# Patient Record
Sex: Female | Born: 1990 | Race: Asian | Hispanic: No | Marital: Single
Health system: Midwestern US, Community
[De-identification: ages and names within clinical notes are randomized; demographics above are authoritative.]

## PROBLEM LIST (undated history)

## (undated) DIAGNOSIS — J45909 Unspecified asthma, uncomplicated: Secondary | ICD-10-CM

## (undated) DIAGNOSIS — J4520 Mild intermittent asthma, uncomplicated: Secondary | ICD-10-CM

## (undated) HISTORY — DX: Unspecified asthma, uncomplicated: J45.909

## (undated) HISTORY — PX: NO PAST SURGERIES: SHX2092

---

## 2013-07-10 ENCOUNTER — Emergency Department (INDEPENDENT_AMBULATORY_CARE_PROVIDER_SITE_OTHER)
Admission: EM | Admit: 2013-07-10 | Discharge: 2013-07-10 | Disposition: A | Discharge status: Home / Self Care | Payer: Medicaid Other | Source: Home / Self Care | Attending: Emergency Medicine | Admitting: Emergency Medicine

## 2013-07-10 ENCOUNTER — Emergency Department (INDEPENDENT_AMBULATORY_CARE_PROVIDER_SITE_OTHER): Payer: Medicaid Other

## 2013-07-10 ENCOUNTER — Encounter (HOSPITAL_COMMUNITY): Payer: Self-pay | Admitting: Emergency Medicine

## 2013-07-10 DIAGNOSIS — J45909 Unspecified asthma, uncomplicated: Secondary | ICD-10-CM

## 2013-07-10 DIAGNOSIS — J020 Streptococcal pharyngitis: Secondary | ICD-10-CM

## 2013-07-10 LAB — POCT RAPID STREP A: Streptococcus, Group A Screen (Direct): POSITIVE — AB

## 2013-07-10 MED ORDER — IPRATROPIUM BROMIDE 0.02 % IN SOLN
0.5000 mg | Freq: Once | RESPIRATORY_TRACT | Status: AC
  Administered 2013-07-10: 0.5 mg via RESPIRATORY_TRACT

## 2013-07-10 MED ORDER — AMOXICILLIN 500 MG PO CAPS: 500.0000 mg | ORAL_CAPSULE | Freq: Three times a day (TID) | ORAL | Status: DC

## 2013-07-10 MED ORDER — ALBUTEROL SULFATE (5 MG/ML) 0.5% IN NEBU
5.0000 mg | INHALATION_SOLUTION | Freq: Once | RESPIRATORY_TRACT | Status: AC
  Administered 2013-07-10: 5 mg via RESPIRATORY_TRACT

## 2013-07-10 MED ORDER — GUAIFENESIN-CODEINE 100-10 MG/5ML PO SYRP: 10.0000 mL | ORAL_SOLUTION | Freq: Four times a day (QID) | ORAL | Status: AC | PRN

## 2013-07-10 MED ORDER — ALBUTEROL SULFATE HFA 108 (90 BASE) MCG/ACT IN AERS: 2.0000 | INHALATION_SPRAY | RESPIRATORY_TRACT | Status: AC | PRN

## 2013-07-10 MED ORDER — ALBUTEROL SULFATE (5 MG/ML) 0.5% IN NEBU
INHALATION_SOLUTION | RESPIRATORY_TRACT | Status: AC
  Filled 2013-07-10: qty 1

## 2013-07-10 MED ORDER — METHYLPREDNISOLONE SODIUM SUCC 125 MG IJ SOLR
125.0000 mg | Freq: Once | INTRAMUSCULAR | Status: AC
  Administered 2013-07-10: 125 mg via INTRAMUSCULAR

## 2013-07-10 MED ORDER — ALBUTEROL SULFATE (5 MG/ML) 0.5% IN NEBU
2.5000 mg | INHALATION_SOLUTION | Freq: Once | RESPIRATORY_TRACT | Status: AC
  Administered 2013-07-10: 2.5 mg via RESPIRATORY_TRACT

## 2013-07-10 MED ORDER — PREDNISONE 20 MG PO TABS: ORAL_TABLET | ORAL | Status: DC

## 2013-07-10 MED ORDER — METHYLPREDNISOLONE SODIUM SUCC 125 MG IJ SOLR
INTRAMUSCULAR | Status: AC
  Filled 2013-07-10: qty 2

## 2013-07-10 MED ORDER — BECLOMETHASONE DIPROPIONATE 80 MCG/ACT IN AERS: 3.0000 | INHALATION_SPRAY | Freq: Two times a day (BID) | RESPIRATORY_TRACT | Status: DC

## 2013-07-10 NOTE — ED Notes (Signed)
Not triaged by this nurse-student ma Vanessa Kick) triaged

## 2013-07-10 NOTE — ED Notes (Signed)
Went to get patient for CXR, patient was getting breathing treatment

## 2013-07-10 NOTE — ED Notes (Signed)
Pt c/o dry cough, sob, sore throat, and aching on left side of chest. Pt states she has a history of asthma and she needs asthma medication. Pt also stated she has only been in this country 2 days (from [place]). No meds taken for sxs. Jan Ranson, SMA

## 2013-07-10 NOTE — ED Provider Notes (Signed)
Chief Complaint:   Chief Complaint  Patient presents with  . Cough    History of Present Illness:   Erin Evans is a 22 year old Nepali female who has just arrived in the country 2 days ago. She has a lifelong history of asthma and has been hospitalized many times in [place] because of asthma but has never been on a ventilator. She's on several Nepali medications including 2 inhalers and a pill. One of the inhalers is beclomethasone, the other one is salbutamol and she has a salbutamol pill that she's taking. Despite these her asthma has not been well controlled. She continues to have coughing, wheezing, and sore throat. She denies any fever or chills. No headache, nasal congestion, chest pain, or GI symptoms.  Review of Systems:  Other than noted above, the patient denies any of the following symptoms. Systemic:  No fever, chills, sweats, fatigue, myalgias, headache, weight loss or anorexia. ENT:  No earache, ear congestion, nasal congestion, sneezing, rhinorrhea, sinus pressure, sinus pain, post nasal drip, or sore throat. Lungs:  No cough, sputum production, or shortness of breath. No chest pain. Skin:  No rash or itching.  PMFSH:  Past medical history, family history, social history, meds, and allergies were reviewed.  No history of allergic rhinitis.  No use of tobacco.   Physical Exam:   Vital signs:  BP 102/70  Pulse 76  Temp(Src) 98.1 F (36.7 C) (Oral)  Resp 15  SpO2 98%  LMP 06/25/2013 General:  Alert, in no distress. No respiratory distress, but she's coughing frequently. Eye:  No conjunctival injection or drainage. Lids were normal. ENT:  TMs and canals were normal, without erythema or inflammation.  Nasal mucosa was clear and uncongested, without drainage.  Mucous membranes were moist.  Pharynx was clear, without exudate or drainage.  There were no oral ulcerations or lesions. Neck:  Supple, no adenopathy, tenderness or mass. Lungs:  No retractions or use of accessory muscles.   No respiratory distress.  She has bilateral expiratory wheezes, no rales or rhonchi. Heart:  Regular rhythm, without gallops, murmers or rubs. Skin:  Clear, warm, and dry, without rash or lesions.  Results for orders placed during the hospital encounter of 07/10/13  POCT RAPID STREP A ([facility] CARE ONLY)      Result Value Range   Streptococcus, Group A Screen (Direct) POSITIVE (*) NEGATIVE   Radiology:  Dg Chest 2 View  07/10/2013   CLINICAL DATA:  22 year old female with cough. Recent international travel.  EXAM: CHEST  2 VIEW  COMPARISON:  None.  FINDINGS: Somewhat large lung volumes. Normal cardiac size and mediastinal contours. Visualized tracheal air column is within normal limits. No pneumothorax, pulmonary edema, pleural effusion or confluent pulmonary opacity. Mild scoliosis. No acute osseous abnormality identified.  IMPRESSION: No focal pneumonia or acute cardiopulmonary abnormality identified.   Electronically Signed   By: Augusto Gamble M.D.   On: 07/10/2013 19:12    Course in Urgent Care Center:   Given Solu-Medrol 125 mg IM and a DuoNeb breathing treatment. Thereafter her lungs sounded better but she was still coughing and was given a second DuoNeb breathing treatment. After that her lungs were clear and she had no coughing and states she feels better.  Assessment:  The primary encounter diagnosis was Asthma. A diagnosis of Strep throat was also pertinent to this visit.  She'll need followup with her primary care physician and was given the name of the [facility]  Plan:  1.  Meds:  The following meds were prescribed:   Discharge Medication List as of 07/10/2013  7:37 PM    START taking these medications   Details  albuterol (PROVENTIL HFA;VENTOLIN HFA) 108 (90 BASE) MCG/ACT inhaler Inhale 2 puffs into the lungs every 4 (four) hours as needed for wheezing., Starting 07/10/2013, Until Discontinued, Normal    amoxicillin (AMOXIL) 500 MG capsule Take 1 capsule  (500 mg total) by mouth 3 (three) times daily., Starting 07/10/2013, Until Discontinued, Normal    beclomethasone (QVAR) 80 MCG/ACT inhaler Inhale 3 puffs into the lungs 2 (two) times daily., Starting 07/10/2013, Until Discontinued, Normal    guaiFENesin-codeine (GUIATUSS AC) 100-10 MG/5ML syrup Take 10 mLs by mouth 4 (four) times daily as needed for cough., Starting 07/10/2013, Until Discontinued, Print    predniSONE (DELTASONE) 20 MG tablet 3 daily for 7 days, 2 daily for 7 days, 1 daily for 7 days., Normal        2.  Patient Education/Counseling:  The patient was given appropriate handouts, self care instructions, and instructed in symptomatic relief.  Suggested she avoid dust, smoke, perfumes, and any allergens.  3.  Follow up:  The patient was told to follow up if no better in 3 to 4 days, if becoming worse in any way, and given some red flag symptoms such as increase in difficulty breathing which would prompt immediate return.  Follow up with [facility] and [facility].       Reuben Likes, MD 07/10/13 2111

## 2013-07-10 NOTE — ED Notes (Signed)
Here in [place] x 2 days; friend translating, had asthma as child

## 2013-09-18 ENCOUNTER — Encounter: Payer: Self-pay | Admitting: Internal Medicine

## 2013-09-18 ENCOUNTER — Ambulatory Visit: Payer: Medicaid Other | Attending: Internal Medicine | Admitting: Internal Medicine

## 2013-09-18 VITALS — BP 98/51 | HR 89 | Temp 98.5°F | Resp 14 | Ht 59.0 in | Wt 104.0 lb

## 2013-09-18 DIAGNOSIS — J45909 Unspecified asthma, uncomplicated: Secondary | ICD-10-CM | POA: Insufficient documentation

## 2013-09-18 DIAGNOSIS — Z23 Encounter for immunization: Secondary | ICD-10-CM

## 2013-09-18 DIAGNOSIS — J449 Chronic obstructive pulmonary disease, unspecified: Secondary | ICD-10-CM

## 2013-09-18 MED ORDER — ALBUTEROL SULFATE HFA 108 (90 BASE) MCG/ACT IN AERS: 2.0000 | INHALATION_SPRAY | Freq: Four times a day (QID) | RESPIRATORY_TRACT | Status: DC | PRN

## 2013-09-18 MED ORDER — BECLOMETHASONE DIPROPIONATE 40 MCG/ACT IN AERS: 1.0000 | INHALATION_SPRAY | Freq: Two times a day (BID) | RESPIRATORY_TRACT | Status: AC

## 2013-09-18 NOTE — Progress Notes (Signed)
Pt here to establish care for hx Asthma [place] interpretor present

## 2013-09-18 NOTE — Progress Notes (Signed)
Patient ID: Erin Evans, female   DOB: [birth year], 22 y.o.   MRN: [id number]  CC: New patient  HPI: 22 year old female with recently diagnosed asthma who comes here to the clinic for followup of recent treatment for asthma exacerbation. Patient was put on albuterol inhaler and prednisone. She still wheezing a mildly and has nonproductive cough. No fever or chills. Her chest tightness comes on with coughing. She does not use any inhaled corticosteroids.  No Known Allergies No past medical history on file. Current Outpatient Prescriptions on File Prior to Visit  Medication Sig Dispense Refill  . albuterol (PROVENTIL HFA;VENTOLIN HFA) 108 (90 BASE) MCG/ACT inhaler Inhale 2 puffs into the lungs every 4 (four) hours as needed for wheezing.  1 Inhaler  3  . amoxicillin (AMOXIL) 500 MG capsule Take 1 capsule (500 mg total) by mouth 3 (three) times daily.  30 capsule  0  . beclomethasone (QVAR) 80 MCG/ACT inhaler Inhale 3 puffs into the lungs 2 (two) times daily.  1 Inhaler  3  . guaiFENesin-codeine (GUIATUSS AC) 100-10 MG/5ML syrup Take 10 mLs by mouth 4 (four) times daily as needed for cough.  120 mL  0  . predniSONE (DELTASONE) 20 MG tablet 3 daily for 7 days, 2 daily for 7 days, 1 daily for 7 days.  42 tablet  0   No current facility-administered medications on file prior to visit.   Asthma in family.   History   Social History  . Marital Status: Single    Spouse Name: N/A    Number of Children: N/A  . Years of Education: N/A   Occupational History  . Not on file.   Social History Main Topics  . Smoking status: Never Smoker   . Smokeless tobacco: Not on file  . Alcohol Use: Not on file  . Drug Use: Not on file  . Sexual Activity: Not on file   Other Topics Concern  . Not on file   Social History Narrative  . No narrative on file    Review of Systems  Constitutional: Negative for fever, chills, diaphoresis, activity change, appetite change and fatigue.  HENT: Negative for ear  pain, nosebleeds, congestion, facial swelling, rhinorrhea, neck pain, neck stiffness and ear discharge.   Eyes: Negative for pain, discharge, redness, itching and visual disturbance.  Respiratory: Negative for cough, choking, chest tightness, shortness of breath, wheezing and stridor.   Cardiovascular: Negative for chest pain, palpitations and leg swelling.  Gastrointestinal: Negative for abdominal distention.  Genitourinary: Negative for dysuria, urgency, frequency, hematuria, flank pain, decreased urine volume, difficulty urinating and dyspareunia.  Musculoskeletal: Negative for back pain, joint swelling, arthralgias and gait problem.  Neurological: Negative for dizziness, tremors, seizures, syncope, facial asymmetry, speech difficulty, weakness, light-headedness, numbness and headaches.  Hematological: Negative for adenopathy. Does not bruise/bleed easily.  Psychiatric/Behavioral: Negative for hallucinations, behavioral problems, confusion, dysphoric mood, decreased concentration and agitation.    Objective:  There were no vitals filed for this visit.  Physical Exam  Constitutional: Appears well-developed and well-nourished. No distress.  HENT: Normocephalic. External right and left ear normal. Oropharynx is clear and moist.  Eyes: Conjunctivae and EOM are normal. PERRLA, no scleral icterus.  Neck: Normal ROM. Neck supple. No JVD. No tracheal deviation. No thyromegaly.  CVS: RRR, S1/S2 +, no murmurs, no gallops, no carotid bruit.  Pulmonary: Wheezing bilaterally in mid lung lobes, my.  Abdominal: Soft. BS +,  no distension, tenderness, rebound or guarding.  Musculoskeletal: Normal range of motion. No edema and  no tenderness.  Lymphadenopathy: No lymphadenopathy noted, cervical, inguinal. Neuro: Alert. Normal reflexes, muscle tone coordination. No cranial nerve deficit. Skin: Skin is warm and dry. No rash noted. Not diaphoretic. No erythema. No pallor.  Psychiatric: Normal mood and  affect. Behavior, judgment, thought content normal.   No results found for this basename: WBC, HGB, HCT, MCV, PLT   No results found for this basename: CREATININE, BUN, NA, K, CL, CO2    No results found for this basename: HGBA1C   Lipid Panel  No results found for this basename: chol, trig, hdl, cholhdl, vldl, ldlcalc       Assessment and plan:   Patient Active Problem List   Diagnosis Date Noted  . 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 she may continue taking albuterol as needed and Qvar for long-term asthma control  - Referral to pulmonology provided

## 2013-09-18 NOTE — Addendum Note (Signed)
Addended by: Alison Murray on: 09/18/2013 12:32 PM   Modules accepted: Orders

## 2013-09-18 NOTE — Patient Instructions (Signed)
Asthma Attack Prevention Although there is no way to prevent asthma from starting, you can take steps to control the disease and reduce its symptoms. Learn about your asthma and how to control it. Take an active role to control your asthma by working with your health care provider to create and follow an asthma action plan. An asthma action plan guides you in:  Taking your medicines properly.  Avoiding things that set off your asthma or make your asthma worse (asthma triggers).  Tracking your level of asthma control.  Responding to worsening asthma.  Seeking emergency care when needed. To track your asthma, keep records of your symptoms, check your peak flow number using a handheld device that shows how well air moves out of your lungs (peak flow meter), and get regular asthma checkups.  WHAT ARE SOME WAYS TO PREVENT AN ASTHMA ATTACK?  Take medicines as directed by your health care provider.  Keep track of your asthma symptoms and level of control.  With your health care provider, write a detailed plan for taking medicines and managing an asthma attack. Then be sure to follow your action plan. Asthma is an ongoing condition that needs regular monitoring and treatment.  Identify and avoid asthma triggers. Many outdoor allergens and irritants (such as pollen, mold, cold air, and air pollution) can trigger asthma attacks. Find out what your asthma triggers are and take steps to avoid them.  Monitor your breathing. Learn to recognize warning signs of an attack, such as coughing, wheezing, or shortness of breath. Your lung function may decrease before you notice any signs or symptoms, so regularly measure and record your peak airflow with a home peak flow meter.  Identify and treat attacks early. If you act quickly, you are less likely to have a severe attack. You will also need less medicine to control your symptoms. When your peak flow measurements decrease and alert you to an upcoming attack,  take your medicine as instructed and immediately stop any activity that may have triggered the attack. If your symptoms do not improve, get medical help.  Pay attention to increasing quick-relief inhaler use. If you find yourself relying on your quick-relief inhaler, your asthma is not under control. See your health care provider about adjusting your treatment. WHAT CAN MAKE MY SYMPTOMS WORSE? A number of common things can set off or make your asthma symptoms worse and cause temporary increased inflammation of your airways. Keep track of your asthma symptoms for several weeks, detailing all the environmental and emotional factors that are linked with your asthma. When you have an asthma attack, go back to your asthma diary to see which factor, or combination of factors, might have contributed to it. Once you know what these factors are, you can take steps to control many of them. If you have allergies and asthma, it is important to take asthma prevention steps at home. Minimizing contact with the substance to which you are allergic will help prevent an asthma attack. Some triggers and ways to avoid these triggers are: Animal Dander:  Some people are allergic to the flakes of skin or dried saliva from animals with fur or feathers.   There is no such thing as a hypoallergenic dog or cat breed. All dogs or cats can cause allergies, even if they don't shed.  Keep these pets out of your home.  If you are not able to keep a pet outdoors, keep the pet out of your bedroom and other sleeping areas at all   times, and keep the door closed.  Remove carpets and furniture covered with cloth from your home. If that is not possible, keep the pet away from fabric-covered furniture and carpets. Dust Mites: Many people with asthma are allergic to dust mites. Dust mites are tiny bugs that are found in every home in mattresses, pillows, carpets, fabric-covered furniture, bedcovers, clothes, stuffed toys, and other  fabric-covered items.   Cover your mattress in a special dust-proof cover.  Cover your pillow in a special dust-proof cover, or wash the pillow each week in hot water. Water must be hotter than 130 F (54.4 C) to kill dust mites. Cold or warm water used with detergent and bleach can also be effective.  Wash the sheets and blankets on your bed each week in hot water.  Try not to sleep or lie on cloth-covered cushions.  Call ahead when traveling and ask for a smoke-free hotel room. Bring your own bedding and pillows in case the hotel only supplies feather pillows and down comforters, which may contain dust mites and cause asthma symptoms.  Remove carpets from your bedroom and those laid on concrete, if you can.  Keep stuffed toys out of the bed, or wash the toys weekly in hot water or cooler water with detergent and bleach. Cockroaches: Many people with asthma are allergic to the droppings and remains of cockroaches.   Keep food and garbage in closed containers. Never leave food out.  Use poison baits, traps, powders, gels, or paste (for example, boric acid).  If a spray is used to kill cockroaches, stay out of the room until the odor goes away. Indoor Mold:  Fix leaky faucets, pipes, or other sources of water that have mold around them.  Clean floors and moldy surfaces with a fungicide or diluted bleach.  Avoid using humidifiers, vaporizers, or swamp coolers. These can spread molds through the air. Pollen and Outdoor Mold:  When pollen or mold spore counts are high, try to keep your windows closed.  Stay indoors with windows closed from late morning to afternoon. Pollen and some mold spore counts are highest at that time.  Ask your health care provider whether you need to take anti-inflammatory medicine or increase your dose of the medicine before your allergy season starts. Other Irritants to Avoid:  Tobacco smoke is an irritant. If you smoke, ask your health care provider how  you can quit. Ask family members to quit smoking too. Do not allow smoking in your home or car.  If possible, do not use a wood-burning stove, kerosene heater, or fireplace. Minimize exposure to all sources of smoke, including to incense, candles, fires, and fireworks.  Try to stay away from strong odors and sprays, such as perfume, talcum powder, hair spray, and paints.  Decrease humidity in your home and use an indoor air cleaning device. Reduce indoor humidity to below 60%. Dehumidifiers or central air conditioners can do this.  Decrease house dust exposure by changing furnace and air cooler filters frequently.  Try to have someone else vacuum for you once or twice a week. Stay out of rooms while they are being vacuumed and for a short while afterward.  If you vacuum, use a dust mask from a hardware store, a double-layered or microfilter vacuum cleaner bag, or a vacuum cleaner with a HEPA filter.  Sulfites in foods and beverages can be irritants. Do not drink beer or wine or eat dried fruit, processed potatoes, or shrimp if they cause asthma symptoms.    Cold air can trigger an asthma attack. Cover your nose and mouth with a scarf on cold or windy days.  Several health conditions can make asthma more difficult to manage, including a runny nose, sinus infections, reflux disease, psychological stress, and sleep apnea. Work with your health care provider to manage these conditions.  Avoid close contact with people who have a respiratory infection such as a cold or the flu, since your asthma symptoms may get worse if you catch the infection. Wash your hands thoroughly after touching items that may have been handled by people with a respiratory infection.  Get a flu shot every year to protect against the flu virus, which often makes asthma worse for days or weeks. Also get a pneumonia shot if you have not previously had one. Unlike the flu shot, the pneumonia shot does not need to be given  yearly. Medicines:  Talk to your health care provider about whether it is safe for you to take aspirin or non-steroidal anti-inflammatory medicines (NSAIDs). In a small number of people with asthma, aspirin and NSAIDs can cause asthma attacks. These medicines must be avoided by people who have known aspirin-sensitive asthma. It is important that people with aspirin-sensitive asthma read labels of all over-the-counter medicines used to treat pain, colds, coughs, and fever.  Beta blockers and ACE inhibitors are other medicines you should discuss with your health care provider. HOW CAN I FIND OUT WHAT I AM ALLERGIC TO? Ask your asthma health care provider about allergy skin testing or blood testing (the RAST test) to identify the allergens to which you are sensitive. If you are found to have allergies, the most important thing to do is to try to avoid exposure to any allergens that you are sensitive to as much as possible. Other treatments for allergies, such as medicines and allergy shots (immunotherapy) are available.  CAN I EXERCISE? Follow your health care provider's advice regarding asthma treatment before exercising. It is important to maintain a regular exercise program, but vigorous exercise, or exercise in cold, humid, or dry environments can cause asthma attacks, especially for those people who have exercise-induced asthma. Document Released: 10/03/2009 Document Revised: 06/17/2013 Document Reviewed: 04/22/2013 [employer] Patient Information 2014 [employer].   Asthma, Adult Asthma is a recurring condition in which the airways tighten and narrow. Asthma can make it difficult to breathe. It can cause coughing, wheezing, and shortness of breath. Asthma episodes (also called asthma attacks) range from minor to life-threatening. Asthma cannot be cured, but medicines and lifestyle changes can help control it. CAUSES Asthma is believed to be caused by inherited (genetic) and environmental  factors, but its exact cause is unknown. Asthma may be triggered by allergens, lung infections, or irritants in the air. Asthma triggers are different for each person. Common triggers include:   Animal dander.  Dust mites.  Cockroaches.  Pollen from trees or grass.  Mold.  Smoke.  Air pollutants such as dust, household cleaners, hair sprays, aerosol sprays, paint fumes, strong chemicals, or strong odors.  Cold air, weather changes, and winds (which increase molds and pollens in the air).  Strong emotional expressions such as crying or laughing hard.  Stress.  Certain medicines (such as aspirin) or types of drugs (such as beta-blockers).  Sulfites in foods and drinks. Foods and drinks that may contain sulfites include dried fruit, potato chips, and sparkling grape juice.  Infections or inflammatory conditions such as the flu, a cold, or an inflammation of the nasal membranes (rhinitis).    Gastroesophageal reflux disease (GERD).  Exercise or strenuous activity. SYMPTOMS Symptoms may occur immediately after asthma is triggered or many hours later. Symptoms include:  Wheezing.  Excessive nighttime or early morning coughing.  Frequent or severe coughing with a common cold.  Chest tightness.  Shortness of breath. DIAGNOSIS  The diagnosis of asthma is made by a review of your medical history and a physical exam. Tests may also be performed. These may include:  Lung function studies. These tests show how much air you breath in and out.  Allergy tests.  Imaging tests such as X-rays. TREATMENT  Asthma cannot be cured, but it can usually be controlled. Treatment involves identifying and avoiding your asthma triggers. It also involves medicines. There are 2 classes of medicine used for asthma treatment:   Controller medicines. These prevent asthma symptoms from occurring. They are usually taken every day.  Reliever or rescue medicines. These quickly relieve asthma symptoms.  They are used as needed and provide short-term relief. Your health care provider will help you create an asthma action plan. An asthma action plan is a written plan for managing and treating your asthma attacks. It includes a list of your asthma triggers and how they may be avoided. It also includes information on when medicines should be taken and when their dosage should be changed. An action plan may also involve the use of a device called a peak flow meter. A peak flow meter measures how well the lungs are working. It helps you monitor your condition. HOME CARE INSTRUCTIONS   Take medicine as directed by your health care provider. Speak with your health care provider if you have questions about how or when to take the medicines.  Use a peak flow meter as directed by your health care provider. Record and keep track of readings.  Understand and use the action plan to help minimize or stop an asthma attack without needing to seek medical care.  Control your home environment in the following ways to help prevent asthma attacks:  Do not smoke. Avoid being exposed to secondhand smoke.  Change your heating and air conditioning filter regularly.  Limit your use of fireplaces and wood stoves.  Get rid of pests (such as roaches and mice) and their droppings.  Throw away plants if you see mold on them.  Clean your floors and dust regularly. Use unscented cleaning products.  Try to have someone else vacuum for you regularly. Stay out of rooms while they are being vacuumed and for a short while afterward. If you vacuum, use a dust mask from a hardware store, a double-layered or microfilter vacuum cleaner bag, or a vacuum cleaner with a HEPA filter.  Replace carpet with wood, tile, or vinyl flooring. Carpet can trap dander and dust.  Use allergy-proof pillows, mattress covers, and box spring covers.  Wash bed sheets and blankets every week in hot water and dry them in a dryer.  Use blankets  that are made of polyester or cotton.  Clean bathrooms and kitchens with bleach. If possible, have someone repaint the walls in these rooms with mold-resistant paint. Keep out of the rooms that are being cleaned and painted.  Wash hands frequently. SEEK MEDICAL CARE IF:   You have wheezing, shortness of breath, or a cough even if taking medicine to prevent attacks.  The colored mucus you cough up (sputum) is thicker than usual.  Your sputum changes from clear or white to yellow, green, gray, or bloody.  You have any   problems that may be related to the medicines you are taking (such as a rash, itching, swelling, or trouble breathing).  You are using a reliever medicine more than 2 3 times per week.  Your peak flow is still at 50 79% of you personal best after following your action plan for 1 hour. SEEK IMMEDIATE MEDICAL CARE IF:   You seem to be getting worse and are unresponsive to treatment during an asthma attack.  You are short of breath even at rest.  You get short of breath when doing very little physical activity.  You have difficulty eating, drinking, or talking due to asthma symptoms.  You develop chest pain.  You develop a fast heartbeat.  You have a bluish color to your lips or fingernails.  You are lightheaded, dizzy, or faint.  Your peak flow is less than 50% of your personal best.  You have a fever or persistent symptoms for more than 2 3 days.  You have a fever and symptoms suddenly get worse. MAKE SURE YOU:   Understand these instructions.  Will watch your condition.  Will get help right away if you are not doing well or get worse. Document Released: 10/15/2005 Document Revised: 06/17/2013 Document Reviewed: 05/14/2013 [employer] Patient Information 2014 [employer].  

## 2013-09-21 ENCOUNTER — Other Ambulatory Visit: Payer: Self-pay | Admitting: Infectious Disease

## 2013-09-21 ENCOUNTER — Ambulatory Visit
Admission: RE | Admit: 2013-09-21 | Discharge: 2013-09-21 | Disposition: A | Discharge status: Home / Self Care | Payer: No Typology Code available for payment source | Source: Ambulatory Visit | Attending: Infectious Disease | Admitting: Infectious Disease

## 2013-09-21 DIAGNOSIS — R079 Chest pain, unspecified: Secondary | ICD-10-CM

## 2013-09-21 DIAGNOSIS — R05 Cough: Secondary | ICD-10-CM

## 2013-09-21 DIAGNOSIS — R059 Cough, unspecified: Secondary | ICD-10-CM

## 2013-10-19 ENCOUNTER — Encounter: Payer: Self-pay | Admitting: Internal Medicine

## 2013-10-19 ENCOUNTER — Ambulatory Visit: Payer: Medicaid Other | Attending: Internal Medicine | Admitting: Internal Medicine

## 2013-10-19 VITALS — BP 108/78 | HR 70 | Temp 98.1°F | Resp 15

## 2013-10-19 DIAGNOSIS — J45909 Unspecified asthma, uncomplicated: Secondary | ICD-10-CM | POA: Insufficient documentation

## 2013-10-19 NOTE — Progress Notes (Signed)
Patient here for follow up-asthma Has been having SOB today Used her inhaler this am

## 2013-10-19 NOTE — Progress Notes (Signed)
MRN: [id number] Name: Namrata Dangler  Sex: female Age: 22 y.o. DOB: 05/26/1991  Allergies: Review of patient's allergies indicates no known allergies.  Chief Complaint  Patient presents with  . Follow-up  . Asthma    HPI: Patient is 22 y.o. female who comes today for followup, she was seen in our office last month has history of asthma started on Qvar and uses albuterol when necessary had a chest x-ray done which was reviewed no acute infection patient reports improvement in the symptoms and was referred as well to the pulmonologist, has not made any appointment yet. Patient denies any fever chills has some shortness of breath.  Past Medical History  Diagnosis Date  . Asthma     History reviewed. No pertinent past surgical history.    Medication List       This list is accurate as of: 10/19/13 11:07 AM.  Always use your most recent med list.               albuterol 108 (90 BASE) MCG/ACT inhaler  Commonly known as:  PROVENTIL HFA;VENTOLIN HFA  Inhale 2 puffs into the lungs every 4 (four) hours as needed for wheezing.     albuterol 108 (90 BASE) MCG/ACT inhaler  Commonly known as:  PROVENTIL HFA;VENTOLIN HFA  Inhale 2 puffs into the lungs every 6 (six) hours as needed for wheezing or shortness of breath.     amoxicillin 500 MG capsule  Commonly known as:  AMOXIL  Take 1 capsule (500 mg total) by mouth 3 (three) times daily.     beclomethasone 80 MCG/ACT inhaler  Commonly known as:  QVAR  Inhale 3 puffs into the lungs 2 (two) times daily.     beclomethasone 40 MCG/ACT inhaler  Commonly known as:  QVAR  Inhale 1 puff into the lungs 2 (two) times daily.     guaiFENesin-codeine 100-10 MG/5ML syrup  Commonly known as:  GUIATUSS AC  Take 10 mLs by mouth 4 (four) times daily as needed for cough.     predniSONE 20 MG tablet  Commonly known as:  DELTASONE  3 daily for 7 days, 2 daily for 7 days, 1 daily for 7 days.        No orders of the defined types were placed in  this encounter.    Immunization History  Administered Date(s) Administered  . Influenza Split 09/18/2013    History reviewed. No pertinent family history.  History  Substance Use Topics  . Smoking status: Never Smoker   . Smokeless tobacco: Not on file  . Alcohol Use: Not on file    Review of Systems  As noted in HPI  Filed Vitals:   10/19/13 1038  BP: 108/78  Pulse: 70  Temp: 98.1 F (36.7 C)  Resp: 15    Physical Exam  Physical Exam  Constitutional: No distress.  Eyes: EOM are normal. Pupils are equal, round, and reactive to light.  Cardiovascular: Normal rate and regular rhythm.   Pulmonary/Chest: Breath sounds normal. No respiratory distress. She has no rales.  Minimal wheezing     CBC No results found for this basename: wbc, rbc, hgb, hct, plt, mcv, neutrabs, lymphsabs, monoabs, eosabs, basosabs    CMP  No results found for this basename: na, k, cl, co2, glucose, bun, creatinine, calcium, prot, albumin, ast, alt, alkphos, bilitot, gfrnonaa, gfraa    No results found for this basename: chol, tri, ldl    No components found with this basename: hga1c  No results found for this basename: AST    Assessment and Plan  Intrinsic asthma Now better continue with qvar and albuterol prn patient to see a pulmonologist.  Return in about 3 months (around 01/17/2014).  Doris Cheadle, MD

## 2013-10-20 ENCOUNTER — Encounter: Payer: Self-pay | Admitting: Internal Medicine

## 2013-10-20 ENCOUNTER — Ambulatory Visit (INDEPENDENT_AMBULATORY_CARE_PROVIDER_SITE_OTHER): Payer: Medicaid Other | Admitting: Internal Medicine

## 2013-10-20 VITALS — BP 110/70 | HR 68 | Wt 108.6 lb

## 2013-10-20 DIAGNOSIS — R05 Cough: Secondary | ICD-10-CM

## 2013-10-20 DIAGNOSIS — J45909 Unspecified asthma, uncomplicated: Secondary | ICD-10-CM

## 2013-10-20 MED ORDER — MONTELUKAST SODIUM 10 MG PO TABS: 10.0000 mg | ORAL_TABLET | Freq: Every day | ORAL | Status: AC

## 2013-10-20 NOTE — Progress Notes (Signed)
Subjective:    Patient ID: Erin Evans, female    DOB: [birth year], 22 y.o.   MRN: [id number] PCP Jeanann Lewandowsky, MD  HPI  IOV 10/20/2013  22 year old [place] female. Speaks only NEpali. Hx through interpreter. Poor historian. Best I can gather  Life long asthmatic. It appeats she was only maintained on salbutamol tablet (ASTHALIN) and mdi life long. Does not look like she was anyICS. This is fairly classic in [place] when people below poverty line. 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 Uses alb prn 4 times a day as rescue. Reports compliance. STill has nocturnal awakening, dyspnea and wheeze. Some better only in [place] compared to [place].  Asthma trigger: cold air, rest not known   SPirometry with hispanic (worng) standard 10/20/2013 - Fev1 1.2L/40%, RAtio 67  CXR 09/21/13 - clear  Past Medical History  Diagnosis Date  . Asthma      Family History  Problem Relation Age of Onset  . Asthma Father      History   Social History  . Marital Status: Single    Spouse Name: N/A    Number of Children: N/A  . Years of Education: N/A   Occupational History  . Not on file.   Social History Main Topics  . Smoking status: Never Smoker   . Smokeless tobacco: Not on file  . Alcohol Use: Not on file  . Drug Use: Not on file  . Sexual Activity: Not on file   Other Topics Concern  . Not on file   Social History Narrative  . No narrative on file     No Known Allergies   Outpatient Prescriptions Prior to Visit  Medication Sig Dispense Refill  . albuterol (PROVENTIL HFA;VENTOLIN HFA) 108 (90 BASE) MCG/ACT inhaler Inhale 2 puffs into the lungs every 4 (four) hours as needed for  wheezing.  1 Inhaler  3  . beclomethasone (QVAR) 40 MCG/ACT inhaler Inhale 1 puff into the lungs 2 (two) times daily.  1 Inhaler  12  . guaiFENesin-codeine (GUIATUSS AC) 100-10 MG/5ML syrup Take 10 mLs by mouth 4 (four) times daily as needed for cough.  120 mL  0  . albuterol (PROVENTIL HFA;VENTOLIN HFA) 108 (90 BASE) MCG/ACT inhaler Inhale 2 puffs into the lungs every 6 (six) hours as needed for wheezing or shortness of breath.  1 Inhaler  12  . amoxicillin (AMOXIL) 500 MG capsule Take 1 capsule (500 mg total) by mouth 3 (three) times daily.  30 capsule  0  . beclomethasone (QVAR) 80 MCG/ACT inhaler Inhale 3 puffs into the lungs 2 (two) times daily.  1 Inhaler  3  . predniSONE (DELTASONE) 20 MG tablet 3 daily for 7 days, 2 daily for 7 days, 1 daily for 7 days.  42 tablet  0   No facility-administered medications prior to visit.        Review of Systems  Constitutional: Negative for fever and unexpected weight change.  HENT: Negative for congestion, dental problem, ear pain, nosebleeds, postnasal drip, rhinorrhea, sinus pressure, sneezing, sore throat and trouble swallowing.   Eyes: Negative for redness and itching.  Respiratory: Positive for cough and shortness of breath.  Negative for chest tightness and wheezing.   Cardiovascular: Negative for palpitations and leg swelling.  Gastrointestinal: Negative for nausea and vomiting.  Genitourinary: Negative for dysuria.  Musculoskeletal: Negative for joint swelling.  Skin: Negative for rash.  Neurological: Negative for headaches.  Hematological: Does not bruise/bleed easily.  Psychiatric/Behavioral: Negative for dysphoric mood. The patient is not nervous/anxious.        Objective:   Physical Exam  Vitals reviewed. Constitutional: She is oriented to person, place, and time. She appears well-developed and well-nourished. No distress.  Short cushingoid  HENT:  Head: Normocephalic and atraumatic.  Right Ear: External ear normal.  Left  Ear: External ear normal.  Mouth/Throat: Oropharynx is clear and moist. No oropharyngeal exudate.  Eyes: Conjunctivae and EOM are normal. Pupils are equal, round, and reactive to light. Right eye exhibits no discharge. Left eye exhibits no discharge. No scleral icterus.  Neck: Normal range of motion. Neck supple. No JVD present. No tracheal deviation present. No thyromegaly present.  Cardiovascular: Normal rate, regular rhythm, normal heart sounds and intact distal pulses.  Exam reveals no gallop and no friction rub.   No murmur heard. Pulmonary/Chest: Effort normal. No respiratory distress. She has wheezes. She has no rales. She exhibits no tenderness.  Periodic cough Occ wheeze +   Abdominal: Soft. Bowel sounds are normal. She exhibits no distension and no mass. There is no tenderness. There is no rebound and no guarding.  Musculoskeletal: Normal range of motion. She exhibits no edema and no tenderness.  Lymphadenopathy:    She has no cervical adenopathy.  Neurological: She is alert and oriented to person, place, and time. She has normal reflexes. No cranial nerve deficit. She exhibits normal muscle tone. Coordination normal.  Skin: Skin is warm and dry. No rash noted. She is not diaphoretic. No erythema. No pallor.  Psychiatric: She has a normal mood and affect. Her behavior is normal. Judgment and thought content normal.          Assessment & Plan:

## 2013-10-20 NOTE — Patient Instructions (Signed)
You have severe asthma STop QVAR. INstead take ADVAR 500/50, 1 puff twice daily Start montelukast 10mg  once nightly Use albuterol 2 puff as needed; not to exceed 4 times a day Anyhtme you feel sick, come here or call us [phone] any time Return in 1 months  - spirometry at followup

## 2013-10-21 ENCOUNTER — Encounter: Payer: Self-pay | Admitting: Internal Medicine

## 2013-10-21 NOTE — Assessment & Plan Note (Signed)
Severe asthma. Posibly remodeled due to lack [place] access [place] meds [place] 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 INstead take ADVAR 500/50, 1 puff twice daily Start montelukast 10mg  once nightly Use albuterol 2 puff as needed; not to exceed 4 times a day Anyhtme you feel sick, come here or call us [phone] any time Return in 1 months  - spirometry at followup

## 2013-11-11 ENCOUNTER — Telehealth: Payer: Self-pay | Admitting: *Deleted

## 2013-11-11 NOTE — Telephone Encounter (Signed)
Received a fax from [facility] stating that the pt needs a PA on montelukast. I called [phone] to initiate PA. Spoke with Johnny BridgeMartha and she states that this medicaiton no longer requires a PA. She states to have the pharmacy run it again and if they still have issues to call them directly and they will walk them through it. I called [facility] at [phone], but they were having computer issues and could not run rx through. I was advised to call back on 30 minutes. WCB. Carron CurieJennifer Castillo, CMA

## 2013-11-12 NOTE — Telephone Encounter (Signed)
[facility] reports pt picked up RX on 10/27/13 and did not require PA. Pt is not eligible for refill yet but once she is they will run RX and if they receive same message then they will contact pt insurance. Nothing furhte rneeded

## 2013-11-23 ENCOUNTER — Ambulatory Visit (INDEPENDENT_AMBULATORY_CARE_PROVIDER_SITE_OTHER): Payer: Medicaid Other | Admitting: Internal Medicine

## 2013-11-23 ENCOUNTER — Encounter: Payer: Self-pay | Admitting: Internal Medicine

## 2013-11-23 VITALS — BP 110/76 | HR 66 | Temp 98.2°F | Wt 103.2 lb

## 2013-11-23 DIAGNOSIS — J45909 Unspecified asthma, uncomplicated: Secondary | ICD-10-CM

## 2013-11-23 MED ORDER — LEVOFLOXACIN 500 MG PO TABS: 500.0000 mg | ORAL_TABLET | Freq: Every day | ORAL | Status: AC

## 2013-11-23 NOTE — Patient Instructions (Addendum)
Take levaquin daily 500mg x 5 days for possible acute bronciitis Continue advair 500/50, 1 puff twice daily Continue singulair 10mg once  A day at night I will talk to your PCP JEGEDE, OLUGBEMIGA, MD about your allergies  - wil be helpful to know true state of asthma control through Exhaled NO  assessment and also evaluate for allergies  - am concerned about remodled asthma Use albuterol 2 puff as needed  Return to see me in 3 months 

## 2013-11-23 NOTE — Progress Notes (Signed)
Subjective:    Patient ID: Erin Evans, female    DOB: 05/03/1991, 23 y.o.   MRN: [id number]  HPI  PCP Jeanann LewandowskyJEGEDE, OLUGBEMIGA, MD   IOV 10/20/2013  23 year old NEpali female. Speaks only NEpali. Hx through interpreter. Poor historian. Best I can gather  Life long asthmatic. It appeats she was only maintained on salbutamol tablet (ASTHALIN) and mdi life long. Does not look like she was anyICS. This is fairly classic in [place] when people below poverty line. 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 Uses alb prn 4 times a day as rescue. Reports compliance. STill has nocturnal awakening, dyspnea and wheeze. Some better only in [place] compared to [place].  Asthma trigger: cold air, smoke rest not known   SPirometry with hispanic (worng) standard 10/20/2013 - Fev1 1.2L/40%, RAtio 67  CXR 09/21/13 - clear  REC You have severe asthma STop QVAR. INstead take ADVAR 500/50, 1 puff twice daily Start montelukast 10mg  once nightly Use albuterol 2 puff as needed; not to exceed 4 times a day Anyhtme you feel sick, come here or call us [phone] any time Return in 1 months  - spirometry at followup   OV 11/23/2013  Chief Complaint  Patient presents with  . Follow-up    c/o chest tx, breathing is unchanged, cough w/ occas green phlem, wheezing.    Presents with profiessional interpreter for asthma followup   She is now on high-dose Advair and daily Singulair. As best as I can ascertain her compliance is good. She says she is confused about her technique with Advair but when I tested it was good. Overall she only feels some better. She still has And down days. I am finding it difficult to ascertain how much  better than baseline she is. She does have some green sputum for the last few to several days but no fever or wheezing. I do not know if she has nocturnal awakenings or high albuterol usage  She did admit to some allergies particularly in the spring and the cold weather but otherwise she does not know   SPriometry feb 0.9L/30%, ratio 48 (hispanic)   Past medical history reviewed no new medical problems   Review of Systems  Constitutional: Negative for fever and unexpected weight change.  HENT: Negative for congestion, dental problem, ear pain, nosebleeds, postnasal drip, rhinorrhea, sinus pressure, sneezing, sore throat and trouble swallowing.   Eyes: Negative for redness and itching.  Respiratory: Positive for cough, chest tightness, shortness of breath and wheezing.   Cardiovascular: Negative for palpitations and leg swelling.  Gastrointestinal: Negative for nausea and vomiting.  Genitourinary: Negative for dysuria.  Musculoskeletal: Negative for joint swelling.  Skin: Negative for rash.  Neurological: Negative for headaches.  Hematological: Does not bruise/bleed easily.  Psychiatric/Behavioral: Negative for dysphoric mood. The patient is not nervous/anxious.        Objective:   Physical Exam  Vitals reviewed. Constitutional: She is oriented to person, place, and time. She appears well-developed and well-nourished. No distress.  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  Right Ear: External ear normal.  Left Ear: External ear normal.  Mouth/Throat: Oropharynx is clear  and moist. No oropharyngeal exudate.  Eyes: Conjunctivae and EOM are normal. Pupils are equal, round, and reactive to light. Right eye exhibits no discharge. Left eye exhibits no discharge. No scleral icterus.  Neck: Normal range of motion. Neck supple. No JVD present. No tracheal deviation present. No thyromegaly present.  Cardiovascular: Normal rate, regular rhythm, normal heart  sounds and intact distal pulses.  Exam reveals no gallop and no friction rub.   No murmur heard. Pulmonary/Chest: Effort normal and breath sounds normal. No respiratory distress. She has no wheezes. She has no rales. She exhibits no tenderness.  Abdominal: Soft. Bowel sounds are normal. She exhibits no distension and no mass. There is no tenderness. There is no rebound and no guarding.  Musculoskeletal: Normal range of motion. She exhibits no edema and no tenderness.  Lymphadenopathy:    She has no cervical adenopathy.  Neurological: She is alert and oriented to person, place, and time. She has normal reflexes. No cranial nerve deficit. She exhibits normal muscle tone. Coordination normal.  Skin: Skin is warm and dry. No rash noted. She is not diaphoretic. No erythema. No pallor.  Psychiatric: She has a normal mood and affect. Her behavior is normal. Judgment and thought content normal.          Assessment & Plan:

## 2013-12-02 ENCOUNTER — Telehealth: Payer: Self-pay | Admitting: Internal Medicine

## 2013-12-02 NOTE — Assessment & Plan Note (Signed)
Take levaquin daily 500mg  x 5 days for possible acute bronciitis Continue advair 500/50, 1 puff twice daily Continue singulair 10mg  once  A day at night I will talk to your PCP JEGEDE, Keane ScrapeLUGBEMIGA, MD about your allergies  - wil be helpful to know true state of asthma control through Exhaled NO  assessment and also evaluate for allergies  - am concerned about remodled asthma Use albuterol 2 puff as needed  Return to see me in 3 months

## 2013-12-02 NOTE — Telephone Encounter (Signed)
plse get hold of pcp JEGEDE, OLUGBEMIGA, MD  for me . Can give cell  If you cannot in a few tries, route message back to me so I can make alternative plan  Thanks  Dr. Kalman ShanMurali Deziray Nabi, M.D., [facility]P Pulmonary and Critical Care Medicine Staff Physician [place] Pager: [phone], If no answer or between  15:00h - 7:00h: call [phone]  12/02/2013 9:27 PM

## 2013-12-08 NOTE — Telephone Encounter (Signed)
LMTCBx1 with doctor.Carron CurieJennifer Najee Manninen, CMA

## 2013-12-18 ENCOUNTER — Telehealth: Payer: Self-pay | Admitting: Internal Medicine

## 2013-12-18 ENCOUNTER — Other Ambulatory Visit: Payer: Self-pay | Admitting: Internal Medicine

## 2013-12-18 DIAGNOSIS — J45909 Unspecified asthma, uncomplicated: Secondary | ICD-10-CM

## 2013-12-18 NOTE — Telephone Encounter (Signed)
D/w pcp JEGEDE, OLUGBEMIGA, MD - patiebt has remodelend asthma. Do not know asthma control. Might have allergies. Need allergy testing and exhaled nitric oxide to know if to step up therapy. [facility]aled [facility] in [place]. Dr Aris GeorgiaMeg Whalen. He is ok with it and will do referral  Dr. Kalman ShanMurali D'Arcy Abraha, M.D., [facility].C.C.P Pulmonary and Critical Care Medicine Staff Physician [facility] Pager: [phone], If no answer or between  15:00h - 7:00h: call [phone]  12/18/2013 4:15 PM

## 2013-12-18 NOTE — Telephone Encounter (Signed)
LMTCBx2 with nurse. Carron CurieJennifer Castillo, CMA

## 2013-12-18 NOTE — Telephone Encounter (Signed)
MR spoke with Doctor. See phone note form 12-18-13. Carron CurieJennifer Shatisha Falter, CMA

## 2014-01-18 ENCOUNTER — Ambulatory Visit: Payer: Medicaid Other | Attending: Internal Medicine | Admitting: Internal Medicine

## 2014-01-18 ENCOUNTER — Encounter: Payer: Self-pay | Admitting: Internal Medicine

## 2014-01-18 VITALS — BP 103/72 | HR 73 | Temp 98.0°F | Resp 16 | Wt 105.0 lb

## 2014-01-18 DIAGNOSIS — R21 Rash and other nonspecific skin eruption: Secondary | ICD-10-CM | POA: Insufficient documentation

## 2014-01-18 DIAGNOSIS — L299 Pruritus, unspecified: Secondary | ICD-10-CM

## 2014-01-18 DIAGNOSIS — Z9109 Other allergy status, other than to drugs and biological substances: Secondary | ICD-10-CM

## 2014-01-18 DIAGNOSIS — J45909 Unspecified asthma, uncomplicated: Secondary | ICD-10-CM | POA: Insufficient documentation

## 2014-01-18 MED ORDER — CETIRIZINE HCL 10 MG PO TABS: 10.0000 mg | ORAL_TABLET | Freq: Every day | ORAL | Status: AC

## 2014-01-18 MED ORDER — HYDROCORTISONE 2.5 % EX CREA: TOPICAL_CREAM | Freq: Two times a day (BID) | CUTANEOUS | Status: AC

## 2014-01-18 NOTE — Progress Notes (Signed)
MRN: [id number] Name: Erin Evans  Sex: female Age: 23 y.o. DOB: 03/27/1991  Allergies: Review of patient's allergies indicates no known allergies.  Chief Complaint  Patient presents with  . Follow-up    HPI: Patient is 23 y.o. female who has history of asthma comes today for followup, she was seen by her pulmonologist and was started on Advair and Singulair, patient reports improvement in the symptoms, reported to have lot of allergies, itchy eyes, stuffy nose, she also reported to have itchy rash on her skin on the legs, she denies any change in soap detergent any cosmetics, denies any fever chills.  Past Medical History  Diagnosis Date  . Asthma     Past Surgical History  Procedure Laterality Date  . No past surgeries        Medication List       This list is accurate as of: 01/18/14 10:35 AM.  Always use your most recent med list.               albuterol 108 (90 BASE) MCG/ACT inhaler  Commonly known as:  PROVENTIL HFA;VENTOLIN HFA  Inhale 2 puffs into the lungs every 4 (four) hours as needed for wheezing.     beclomethasone 40 MCG/ACT inhaler  Commonly known as:  QVAR  Inhale 1 puff into the lungs 2 (two) times daily.     cetirizine 10 MG tablet  Commonly known as:  ZYRTEC  Take 1 tablet (10 mg total) by mouth daily.     guaiFENesin-codeine 100-10 MG/5ML syrup  Commonly known as:  GUIATUSS AC  Take 10 mLs by mouth 4 (four) times daily as needed for cough.     hydrocortisone 2.5 % cream  Apply topically 2 (two) times daily.     levofloxacin 500 MG tablet  Commonly known as:  LEVAQUIN  Take 1 tablet (500 mg total) by mouth daily.     montelukast 10 MG tablet  Commonly known as:  SINGULAIR  Take 1 tablet (10 mg total) by mouth at bedtime.        Meds ordered this encounter  Medications  . cetirizine (ZYRTEC) 10 MG tablet    Sig: Take 1 tablet (10 mg total) by mouth daily.    Dispense:  30 tablet    Refill:  3  . hydrocortisone 2.5 % cream   Sig: Apply topically 2 (two) times daily.    Dispense:  30 g    Refill:  0    Immunization History  Administered Date(s) Administered  . Influenza Split 09/18/2013    Family History  Problem Relation Age of Onset  . Asthma Father     History  Substance Use Topics  . Smoking status: Never Smoker   . Smokeless tobacco: Not on file  . Alcohol Use: Not on file    Review of Systems   As noted in HPI  Filed Vitals:   01/18/14 0940  BP: 103/72  Pulse: 73  Temp: 98 F (36.7 C)  Resp: 16    Physical Exam  Physical Exam  HENT:  Some nasal congestion  Eyes: EOM are normal. Pupils are equal, round, and reactive to light.  Cardiovascular: Normal rate and regular rhythm.   Pulmonary/Chest: She has no rales.  Minimal wheezing  Musculoskeletal:  Rash on both arms    CBC No results found for this basename: wbc, rbc, hgb, hct, plt, mcv, neutrabs, lymphsabs, monoabs, eosabs, basosabs    CMP  No results found for this  basename: na, k, cl, co2, glucose, bun, creatinine, calcium, prot, albumin, ast, alt, alkphos, bilitot, gfrnonaa, gfraa    No results found for this basename: chol, tri, ldl    No components found with this basename: hga1c    No results found for this basename: AST    Assessment and Plan  Intrinsic asthma Continue with Advair, albuterol when necessary and Singulair following up with pulmonology.  Environmental allergies - Plan: cetirizine (ZYRTEC) 10 MG tablet  Itching - Plan: cetirizine (ZYRTEC) 10 MG tablet  Rash and nonspecific skin eruption - Plan: hydrocortisone 2.5 % cream  Return in about 4 months (around 05/20/2014) for asthma.  Doris Cheadle, MD

## 2014-01-18 NOTE — Progress Notes (Signed)
Patient here for follow up-asthma

## 2014-03-14 IMAGING — CR DG CHEST 1V
1 series · 1 of 1 positions shown · non-contrast
Comparison: 07/10/2013

CLINICAL DATA: 22-year-old female with cough.

EXAM:
CHEST - 1 VIEW

[w chest pa]
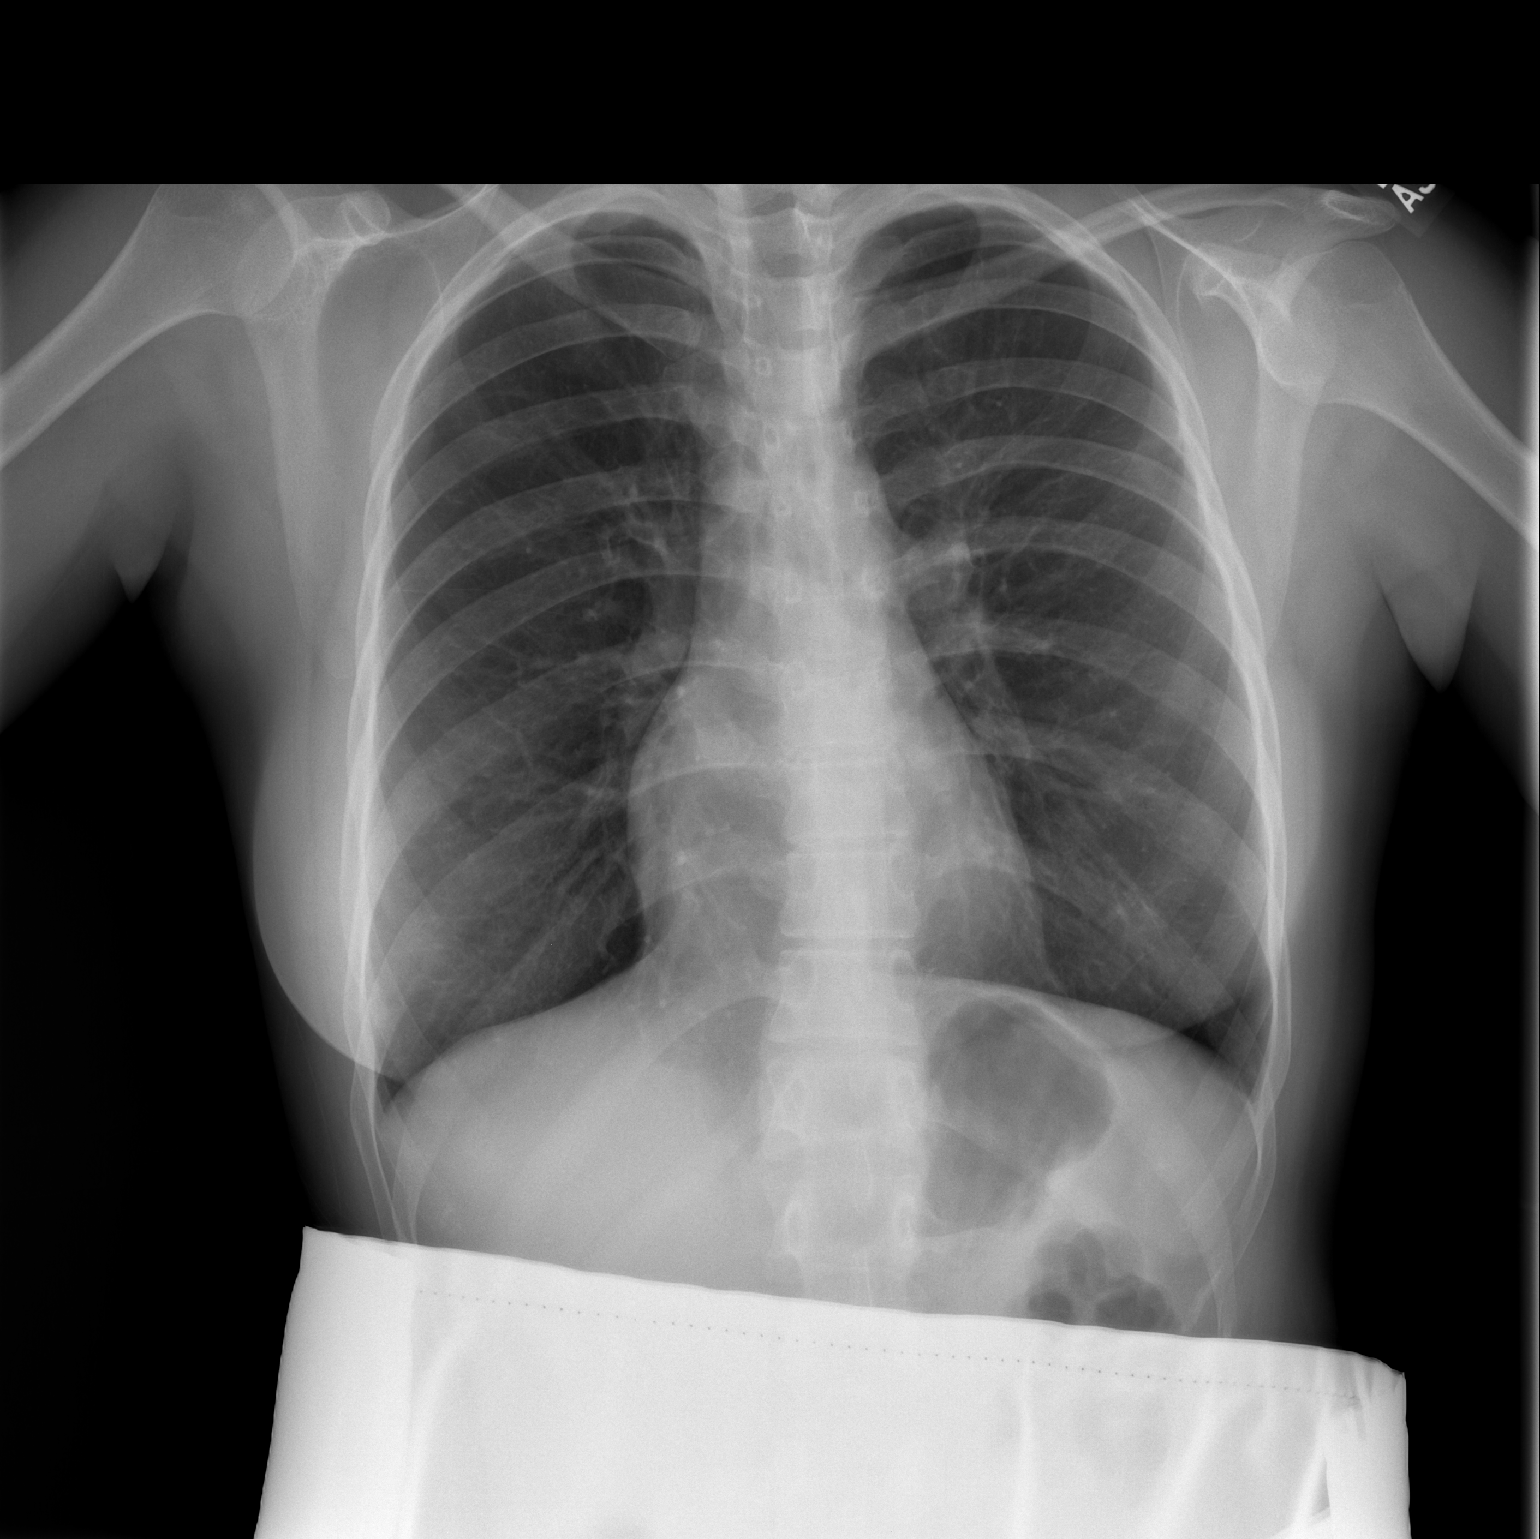

[1 of 1 positions shown; findings below may reference images not displayed]

FINDINGS: The cardiomediastinal silhouette is unremarkable.

The lungs are clear.

There is no evidence of focal airspace disease, pulmonary edema,
suspicious pulmonary nodule/mass, pleural effusion, or pneumothorax.
No acute bony abnormalities are identified.
IMPRESSION: No evidence of active cardiopulmonary disease.

## 2014-05-20 ENCOUNTER — Ambulatory Visit: Payer: Medicaid Other | Admitting: Internal Medicine

## 2019-10-30 NOTE — L&D Delivery Note (Signed)
Patient: Terri Summers   DOB: 08-Mar-1991  MRN: [id number]        Date of Procedure: 10/13/20        Active Problems:    * No active hospital problems. *  Resolved Problems:    * No resolved hospital problems. *           PREOPERATIVE DIAGNOSES:     1. G2P1001 at [redacted]w[redacted]d    2. Unscheduled RCD, failed TOLAC   3. Abdominal pain/Concern for uterine rupture   3. PreEwSF (LFTs)   4. Asthma       POSTOPERATIVE DIAGNOSES:    1. Same         2. Living Infant, female            PROCEDURE:     Repeat low transverse cesarean section      SURGEON:     Dr. Ila Mcgill     ASST:     Dr. Maurice March, Dr. Loleta Chance    ANESTHESIA:    general    ANTIBIOTIC(S):     2g Ancef and Azithromycin    VAG PREP (IODINE):   Yes    FINDINGS:      Normal appearing bilateral fallopian tubes and ovaries. Large approximately 6cm right broad ligament seroma, otherwise normal appearing uterus. There was no evidence of uterine rupture at the time of delivery.     FLUIDS:      1800 ml crystalloids     URINE:      200 ml      EBL:       600 ml    QBL:     Quantitative Blood Loss (mL): (!) 1159 grams    DRAINS:     foley catheter    SPECIMENS:     none    COMPLICATIONS:     Bladder adhesions requiring a high hysterotomy in the contractile portion of the uterus. Would recommend a REPEAT CESAREAN for subsequent pregnancies and advise against future TOLAC.    CONDITION:      good,    transferred to :  post anesthesia recovery    MEDICATIONS:    TXA preop. Surgicel SNOW was applied to hysterotomy for hemostasis.          FINDINGS:     Live female infant,   head first  APGARS   1 min: 7   5 min: 9  Birth Weight: 3530 grams   Tubes and ovaries:  within normal limits.       Fetal Description: baby anatomically normal    DETAILS OF PROCEDURE:   Patient was brought into the operating room and she was placed in the supine position slightly tilted to the left. The abdomen was prepped and draped in the usual manner.   Anesthesia level was checked and was found to be adequate. The  abdomen was entered through a Pfannenstiel incision.  The incision was carried through the subcutaneous tissue to the fascia which was nicked in the midline and carried transversely using mayo scissors. Kochers were used to grasp the edges of the fascia and sharp and blunt dissection used to reflect the underlying rectus abdominis muscles. At this time the patient was uncomfortable and there was concern that her epidural anesthesia was inadequate and patient was placed under general anesthesia. The muscles were separated in the midline bluntly and sharply exposing the parietal peritoneum. The peritoneum was entered bluntly. The peritoneal incision was extended bluntly with good visualization of bladder. Bladder retractor  was placed as well as Editor, commissioning.    Upon entry to the peritoneum there were bladder adhesions to the previous hysterotomy. Upon initial exam, there was no evidence of uterine rupture. However, a large right broad ligament seroma was noted with clear fluid and did not appear to be expanding. This seroma did not enter the uterine cavity. A decision to made to enter the cavity high on the uterus with a transverse incision. The amniotic cavity was entered and Clear amniotic fluid was noted.   Bladder retractor and Richardson retractor were removed, and baby was delivered from the Cephalic presentation with fundal pressure without difficuty.The baby was handed over to the NICU personnel standing by. Placenta was extracted intact with gentle traction. The uterus was exteriorized and wrapped in a wet lap. The uterine cavity was swept and cleared of all clots and debris using a dry lap.   The uterine incision was closed using a continuous locked suture  of 0 Monocryl x2. A second layer was not performed. Attention was turned to the Right broad ligament seroma which had not expanded. The bladder was backfilled with methylene blue and there was no extravasation of fluid. Surgicel SNOW was applied  to the hysterotomy and hemostasis was ensured. The uterus was replaced in the abdomen. Hemostasis of the operative field was ensured and the gutters were cleared of all clots and debris. Subcutaneous closure was performed with 3-0 Monocryl. Hemostasis was ensured. Skin closure was performed with 4-0 monocryl suture and steri-strips. Sponge, needle and instrument counts were correct twice. Patient was transferred to the recovery room in satisfactory stable condition.      Allen Kell Rosario Duey, DO   10/13/2020 10:25 AM    LABOR DELIVERY ???     SCD's ONLY (labor through postpartum ambulation) SCD's PLUS   Prophylactic Anticoagulation   until discharge SCD's PLUS   Prophylactic Anticoagulation   for 6 weeks SCD's PLUS  Therapeutic Anticoagulation for 6 weeks   Vaginal Delivery   []  BMI ? 40 kg/m2          Cesarean Delivery   All patients Vaginal Delivery   []  BMI ? 40 kg/m2       AND  []  Antepartum hospitalization ? 72 hours within the past month    Cesarean Delivery   1 Major Risk Factor:  []  BMI ? 35 kg/m2   []  Low Risk Thrombophilia  []  PPH+RBCs, IR, or operation  []  Infection+Antibiotics  []  Antepartum hospitalization ? 72 hours within the past month   []  PMH: Sickle Cell, SLE, Cardiac Dz, Active IBD, Active Cancer, Nephrotic Syndrome    OR 2 Minor Risk Factors:  []  Multiple gestation  []  Age > 40  []  PPH ? 1,000cc  []  (+)FMH of VTE  []  Smoker  []  Preeclampsia []  BMI ? 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

## 2020-03-16 ENCOUNTER — Telehealth: Admit: 2020-03-16 | Discharge: 2020-03-16 | Payer: MEDICAID | Attending: Nurse Practitioner

## 2020-03-16 DIAGNOSIS — Z3481 Encounter for supervision of other normal pregnancy, first trimester: Secondary | ICD-10-CM

## 2020-03-16 MED ORDER — PRENATAL 27-1 MG PO TABS
27-1 MG | ORAL_TABLET | Freq: Every day | ORAL | 11 refills | Status: DC
Start: 2020-03-16 — End: 2020-08-30

## 2020-03-16 MED ORDER — DOXYLAMINE SUCCINATE (SLEEP) 25 MG PO TABS
25 MG | ORAL_TABLET | Freq: Every evening | ORAL | 3 refills | Status: DC
Start: 2020-03-16 — End: 2020-10-12

## 2020-03-16 MED ORDER — PYRIDOXINE HCL 25 MG PO TABS
25 MG | ORAL_TABLET | Freq: Four times a day (QID) | ORAL | 3 refills | Status: DC
Start: 2020-03-16 — End: 2020-10-16

## 2020-03-16 NOTE — Telephone Encounter (Signed)
Virtual visit done to confirm pregnancy  LMP  01-16-2020  EDD  10-22-2020  8w 4d today    Needs [place] dates  Needs new OB exam/labs with me right after the [place]    RN intake not needed, OB chart is open

## 2020-03-18 NOTE — Telephone Encounter (Signed)
[place] was scheduled thru central scheduling for 5/27 at 10: however I changed it to accomodate the 2 appointments being back to back

## 2020-03-18 NOTE — Telephone Encounter (Signed)
Financial counselign and [place] schedule by central scheduling for 5/26, however, you have no Openings that day except for 8 am or virtuals at 11 and 11:15. How would you like me to proceed. She cannot come in at 8am

## 2020-03-24 ENCOUNTER — Other Ambulatory Visit: Admit: 2020-03-24 | Discharge: 2020-03-24 | Payer: MEDICAID | Attending: Nurse Practitioner

## 2020-03-24 ENCOUNTER — Inpatient Hospital Stay: Admit: 2020-03-24 | Attending: Nurse Practitioner

## 2020-03-24 DIAGNOSIS — Z3481 Encounter for supervision of other normal pregnancy, first trimester: Secondary | ICD-10-CM

## 2020-03-24 NOTE — Patient Instructions (Signed)
Patient Education        ??????? ???????????? 10 ???? 14 ?????????: ??????? ???????????  Weeks 10 to 14 of Your Pregnancy: Care Instructions  ??????? ??????? ???????????     ??????? ???????????? 10 ???? 14 ????????, ??????? ??????????? ?????? ????? ?????? ?? ????? ????????????? ????? ?????? ???? ??????? ??????? ?????? ????? ?????? ????? ?????? ??????? ??????? ???? ???? ? ????? ??????? ?????? ? ????????? ?????? ???????  ?? ???? ?????? ???? ?????? ???? ?????? ?????? ??? ??? ?????? ??? ???????? ??????? ???? ????????? ? ?????? ????????? ???????????? ???????? ??????? ???? ??? ???? ???????? ???????? ???????????? ??????? ????? ??????? ??????? ??? ???? ???? ??????????? ??????? ??????? ???? ??? ???? ?? ????? ????? ?????? ????????  ?? ??????? ????? ?? ?????? ??????? ????? ??? ????? ? ??????? ?????????? ??????? ?????? ?? ????????? ???? ??? ????????? ?????? ???????? ???? ???????????  ???-?? ?????? ??????? ????? ??? ????????? ????? ??? ??? ??? ????????????? ??? ??? ????? ??????? ????????? ???? ??? ???????? ????????? ??????? ? ??? ????? ????????? ?? ?????????? ????? ?????? ????????? ???? ????? ??? ????? ???? ????????? ???? ?????? ??? ?? ?????? ????? ???  ??????? ???? ????? ??????? ???? ???? ???????????  ????????? ?????? ?????? ?????????  ?? ??????? ???? ???????? ???? ???? ??????????? ? ??????? ?????????? ????? ????? ?????? ???? ???????? ???? ??????? ????? ?????? ????????? ?????? ????? ????, ???????? ?????? ??????? ???? ????? ????, ???? ??????????? ?????? ?????? ????? ??????? ???????? ? ???? ????????? ????????? ??? ??????? ?? ???? ????? ????? ?????? ???????????  ? ?????? ?? ?????? (?????) ????? ??? ??????????? ?? ?????????? ?????????? ???????????? 15 ? 20 ??????? ????? ???? ????????? ????????? ??????? ????? ???? ??? ?? ??? ??????????? ?????? ???? ??????? ??????? ??????? ???? ????????? ??? ????? ?????????? ????? ?????, ???????? ??????? ???? ? ???? ??????????? ?? ??????? ???????? ????????  ? ???????????????? ?? ??????? ???????  ??????? ???????? ??????????? ????????? ????? ?????? ??????? ?? ???????????? 15 ???? 20 ??????? ?????, ?????????? 16 ??????? ???????? ???? ???????  ? ??? ???????????????? ???????? ?? ????????? ??????????????????? ??????? ??????? ??????? ????????? ??????? ???? ?????? ??????? ?????? ???? ???? ???????????? ?????????? ?????? ??? ?????  ? ????????? ???? ???????? (CVS)? ?? ????????? ??????? ??????? ???????? ???? ???????? ????????? ??????? ??????? ??????? ????? ???? ??? ????????(???????)?? ?? ???????????? ???? ?????? ??????? ???? ??????? ?? ??????? ????? 10 ???? 13 ??????? ??????? ????? ???????  ????????? ???  ?? ???? ?????? ???????? ????????? ??? ???????????  ?? ??????? ?????????? ????-???? ????, ?????? ???? ?????????? ????, ?????? ?????? ? ?????????? ????????? ??????? ?????  ?? ??????? ??????? ??? ????, ??? ?????? ?????? ?????? ?????????? ??????? ??????? ?????? ? ???? ??? ???? ?????, ????? ?????? ??????????? ????????????  ?? ???????? ????? ??????:  ? ???? ?? ????? ????? ???????? ??????????  ? ???????? ???????? ??? ?????? ??????????  ? ??????? ???? ?????? ????? ????? ???? ???? ?????????  ? ???????? ?????? ????? ???? ????? ????? ??????????? ????? ???????????  ? ????? ?????????????? ????????? ????? ??????????? ????? ?????? ??????? ?????????? ?????? ??????????  ?? ???????? ????? ?????? ????:  ? ?????? ???????????  ? ????? ???? ?? ????????? ??????? ????? ?????? ???? ??????????  ? ??????? ??????? ?? ??????? ??????? ????? ???? ???? ?????? ??????????  ? ???????? ???? ?????? ???? ???????????? (????????) ?????? ?????????? ??????? ???????? ?? ???? ? ?????????? ??????????? (??????, ???????) ?? ????????????? (????) ????? ???-??????? ????????? ??????? (NSAIDs) ?????? ???????????  ?? ??????? ?????? ??? ???? ????, ??? ???????????? ? ??????? ???? ????? ??????????? ?????? ???? ????????? ?????, ?????????? ?????? ???? ???? ????? ?????????, ??????? ???????? ????? ????? ?????? ??????????  ?? ??????? ??? ?????? ????, ?????? (???????) ??? ?????? ??????  ????? ?????????? ?????????????? ?????? ?????? ???????????  ??????? ????????? ???????  ?? ???????? ?????? ????? ???? ???? ???????????  ?? ?????????? ??????? ?????? ???? ??????????? ??????? ?????  ? ??????? ??????? ???? ? ?? ???? ??? ??????? ??????????  12 ??????????? ????????? ??? ??????  ? ??????? ??????? ???? ? ???? ??? ?????? ? ??????? ???????? ?????? ???? ?-???? ??????? ????? ???????  ? ??????? ???? ? ?????? ??? ?????? ????? ???????  ?? ??????? ?????? "??? ????" ?????? ??????????? ????????? ????? ?????????????? ???? ??????  ??????? ?? ???? ????? ???????????  ???? ????????   [email or web address]  ???????? ????????? E090 ?????? ?? ????? ??? ?????? "??????? ???????????? 10 ???? 14 ?????????: ??????? ???????????."  ?? ????? ???: 2020 10 08??????????????????????????????????????? ?????: 12.8  ?? 2006-2021 Healthwise, Incorporated.   ???????? ?????????????? ?????? ???????? ???????? ??????? ????????? ?????? ????? ??????? ??? ???????? ?? ???????? ?????? ?? ?? ?????????? ?????? ????????? ??? ???, ???? ????? ????????? ?????? ????? ????????? ??????????? Healthwise, Incorporated, ?? ??????? ?? ????????? ???????? ???? ???? ??? ???????? ?? ?????????? ???????? ??????

## 2020-03-24 NOTE — Telephone Encounter (Signed)
Can you please place order for Unity complete with fetal sex?   thank you!

## 2020-03-24 NOTE — Progress Notes (Signed)
Patient is here for Unity with gender,NOB,hemoglobin fractionation profile, hemoglobin A1C, CBC blood draw. Patient had blood drawn from left antecubital. Patient tolerated procedure well.  [place], [place]

## 2020-03-25 LAB — HEMOGLOBIN A1C
Hemoglobin A1C: 5.4 %
eAG: 108 mg/dL

## 2020-03-25 LAB — CBC WITH AUTO DIFFERENTIAL
Absolute Baso #: 0.1 10*3/uL (ref 0.0–0.2)
Absolute Eos #: 0.3 10*3/uL (ref 0.0–0.5)
Absolute Lymph #: 2.2 10*3/uL (ref 1.0–4.3)
Absolute Mono #: 0.6 10*3/uL (ref 0.0–0.8)
Absolute Neut #: 6.9 10*3/uL (ref 1.8–7.0)
Basophils: 0.5 % (ref 0.0–2.0)
Eosinophils: 3.3 % (ref 1.0–6.0)
Granulocytes %: 68.3 % (ref 40.0–80.0)
Hematocrit: 36.6 % (ref 35.0–47.0)
Hemoglobin: 12.4 g/dL (ref 11.7–16.0)
Lymphocyte %: 21.7 % (ref 20.0–40.0)
MCH: 28.9 pg (ref 26.0–34.0)
MCHC: 33.9 % (ref 32.0–36.0)
MCV: 85.1 fL (ref 79.0–98.0)
MPV: 9.8 fL (ref 7.4–10.4)
Monocytes: 6.2 % (ref 2.0–10.0)
Platelets: 258 10*3/uL (ref 140–440)
RBC: 4.3 10*6/uL (ref 3.80–5.20)
RDW: 14.3 % (ref 11.5–14.5)
WBC: 10.1 10*3/uL (ref 3.6–10.7)

## 2020-03-25 LAB — RPR WITH FTA REFLEX: RPR: NONREACTIVE NA

## 2020-03-25 LAB — C. TRACHOMATIS / N. GONORRHOEAE, DNA
C. trachomatis DNA: NOT DETECTED
NEISSERIA GONORRHOEAE, DNA: NOT DETECTED

## 2020-03-25 LAB — RUBELLA IMMUNE: Rubella IgG Scr: 35.7

## 2020-03-25 LAB — HEPATITIS B SURFACE ANTIGEN: Hepatitis B Surface Ag: NOT DETECTED NA

## 2020-03-25 LAB — HIV SCREEN: HIV 1+2 AB+HIV1P24 AG, EIA: NONREACTIVE NA

## 2020-03-25 LAB — ANTIBODY SCREEN: Antibody Screen: NEGATIVE NA

## 2020-03-25 LAB — ABO/RH: Rh Type: POSITIVE NA

## 2020-03-25 LAB — HEPATITIS C ANTIBODY: Hepatitis C Ab: NOT DETECTED NA

## 2020-03-25 NOTE — Telephone Encounter (Signed)
I do not see an appointment for NT [place]  See if was scheduled, if not needs scheduled  11w 2d today

## 2020-03-25 NOTE — Progress Notes (Signed)
Pt here for OB visit.    No LOF or bleeding.  No contractions.   History reviewed - see episode report  No nausea or vomiting.    PE:  See vitals  Pt A&OX3, NAD  Normal affect  Non labored breathing  Abd - non tender  Ext - +1 b/l edema    RTO 4 weeks

## 2020-03-26 LAB — CULTURE, URINE: Urine Culture, Routine: NORMAL

## 2020-03-29 ENCOUNTER — Institutional Professional Consult (permissible substitution): Admit: 2020-03-29 | Discharge: 2020-03-29 | Payer: MEDICAID | Attending: Clinical

## 2020-03-29 DIAGNOSIS — Z3491 Encounter for supervision of normal pregnancy, unspecified, first trimester: Secondary | ICD-10-CM

## 2020-03-29 NOTE — Telephone Encounter (Signed)
The NT was completed on 5/27 according to the orders it is marked as completed??

## 2020-03-29 NOTE — Telephone Encounter (Signed)
Spoke to the patients husband and scheduled the NT scan for 6/3 at 9 am

## 2020-03-29 NOTE — Telephone Encounter (Signed)
Noted  
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
## 2020-03-31 ENCOUNTER — Encounter

## 2020-04-01 LAB — HGB FRAC PROFILE
Erythrocytes, Urine: 4.06 10*6/uL (ref 3.80–5.10)
Hematocrit: 35.3 % (ref 35.0–45.0)
Hemoglobin A/Hemoglobin Total: 97.6 % (ref >96.0)
Hemoglobin A2/Hemoglobin Total: 2.4 % (ref 2.2–3.2)
Hemoglobin: 11.9 g/dL (ref 11.7–15.5)
Hgb F Quant: 0 % (ref <2.0)
MCH: 29.3 pg (ref 27.0–33.0)
MCV: 86.8 FL (ref 80.0–100.0)
RDW: 14.6 % (ref 11.0–15.0)

## 2020-04-01 LAB — PAP SMEAR

## 2020-04-21 ENCOUNTER — Ambulatory Visit: Admit: 2020-04-21 | Discharge: 2020-04-21 | Payer: MEDICAID | Attending: Nurse Practitioner

## 2020-04-21 ENCOUNTER — Encounter: Attending: Nurse Practitioner

## 2020-04-21 ENCOUNTER — Ambulatory Visit: Admit: 2020-04-21 | Discharge: 2020-04-21 | Payer: MEDICAID | Attending: Clinical

## 2020-04-21 DIAGNOSIS — K219 Gastro-esophageal reflux disease without esophagitis: Secondary | ICD-10-CM

## 2020-04-21 DIAGNOSIS — Z3492 Encounter for supervision of normal pregnancy, unspecified, second trimester: Secondary | ICD-10-CM

## 2020-04-21 LAB — GLUCOSE TOLERANCE, 1 HOUR: Gluc Chal: 88 mg/dL (ref <140)

## 2020-04-21 MED ORDER — FAMOTIDINE 20 MG PO TABS
20 MG | ORAL_TABLET | Freq: Two times a day (BID) | ORAL | 6 refills | Status: DC
Start: 2020-04-21 — End: 2020-07-25

## 2020-04-21 NOTE — Patient Instructions (Signed)
Patient Education        ??????? ???????????? 14 ???? 18 ?????????: ??????? ???????????  Weeks 14 to 18 of Your Pregnancy: Care Instructions  ??????? ??????? ???????????     ?? ??????, ??????? ???????? ??????? ???? ?????????? ?????? ??????? ?????? ???????????? ????? ??????? ???????????? ??????? ????? ?????? ???? ???? ??????????? ????? ??? ??? ??????????, ?????: ??????? ???????????? ??????? ????? ???????? ?? ??????? ???????? ???? ?????????  ??????? ????? ????? ????? ???? ????? ????????? ? ??????? ??????? ????? ????? (?????????) ?????? ???????????? ?????? ???? ??????? ??????????? ??????? ??????? ??????? ???? ??? ??? ???? ??????  ??????? ????? ?????, 18 ? 20 ??????? ????? ??????? ???????? ????????????? ??????? ???? ??????????? ????????? ??????? ????????? ????? ??????????? ???? ???? ???? ?????? ?????? ??????? ???????? ??????? ?????? ????? ???? ??? ??????????? ??????? ???? ???? ?? ???? ?? ?? ??? ??? ????? ??????????? ?? ??????????? ????? ?????? ????? ????? ?? ???? ????? ??? ???  ???????????? ?????? ???????? ???????? ????? ????? ?????? ???? ?????? ?????? ?????? ?????? ??????? ????????? ???? ??????????  ??????? ?????????? ???? ???? ????????, ?????? ?? ??????? ????? ?????? ??????? ??? ??????? ???? ????????? ???????? ??????? ?? ? ??????? ???? ????, ??????? ?????? ????????? ? ??????? ????? ?????? ????? ????????? ?? ??????? ????? ????? ????????? ???? ??? ?????? ? ??????? ????? ???? ????? ???????????  ???-?? ?????? ??????? ????? ??? ????????? ????? ??? ??? ??? ????????????? ??? ??? ????? ??????? ????????? ???? ??? ???????? ????????? ??????? ? ??? ????? ????????? ?? ?????????? ????? ?????? ????????? ???? ????? ??? ????? ???? ????????? ???? ?????? ??? ?? ?????? ????? ???  ??????? ???? ????? ??????? ???? ???? ???????????  ???? ??????????  ?? ?????? ? ?? ???????? ???????? ??????? ???? ???????????  ?? ???? ?? ?? ?????? ????? ???????? ???? ?????? ????? ??????????? ?? ??????? ?? ???????????????? ???????? ???????????  ?? ???? ??? ????  ?????????? 10 ???? 15 ??????? ????????? ???? ??? ????? ????? ???? ???????????, ?????? ?????????? ?? ?????? ??? ?????? ????? ?????????  ?? ???????????? ?? ??? ??????? ??????? ?????????? ??????????? ?? ???? ??????? ??? ???????????  ?? ????? ????????? ? ??????? ??????????? ??????? ??????? ???? ??????????? ???? ????? ??????? ????? ?????????? ????? ???? ?????????? ??? ????????? ??????? ???? ??? ?????? ????????? ? ?????????? ???????? ?????? ??????????  ???????  ?? ??????? ??????????????? ????? ??? ??????? ?????????? ????, ????????-???????? ???? ?????????? ?????? ????? ??? ??????? ??? ???????? ? ???? ??? ???-???? ????? ????? ?????????  ?? ???????? ??????, ???????? ??????, ??-?????? ????????, ?????? ???????? ? ??? ?????? ??????? ????? ?????? ????????, ?????????, ??????? ??????, ????????????? ? ?????? ??????????? ???? ??????, ?????? ?????? ???????  ?? ?? ?? ?? ??????? 2?? ??????? ????? ??????? ????? ????? ????? ?????, ????? ?????? ?????????? ?? ????? ?? ????? ??????? ??? ??????? 5 ???, ?? ????? 30 ????? ?????? ????? ??? ??????? ??? ?? ???????? 10 ????? ?? ????? ???? ?????? ?????? ????? ??? ???  ?? ?????? ???? ??????????? ?????? ????? ?????? ????? ?????? ? ???? ???????????  ?? ??????? ??????? ???? ???? ???? ??? ????????? ? ?????? ???? ???? ???????????  ?? ??????? ??? ?????? ???? ??????, ????? ??? ?????? ???? ????????? ?????????? ????????? ??????? ???????? ???? ?????????  ????? ???????? ??? ???? ???? ??????????  ?? ??????? ???????????? ??????? 3 ??????? ?????? ?? ??????? ???? 1 ?????? ??? ??????? ???? ????????????? ?????? ????????  ?? ??????? ???????????? ?????? 6 ??????? ??? 25 ???? 35 ???????????? ??? ??????????? ??????? 1 ?????? ??? ??????? ???? ????????????? ?????? ????????  ?? ??????? ??? ????? ????, ??????? ??? ??? ?????? ????? ????? (???? 28 ???? 40 ??????????)?  ?? ??????? ??? ???? ????, ??????? ???? ??? (???? 15 ???? 25 ??????????) ?????? ????? ???????  ?? ??????? ??? ?????? ????? ????, ????????? ????? ?????? ?????????? ???? ???  ??????? ????????? ? ?????, ????? ????? ? ???? ????? ?????????? ???? ?????? ????, ????? ? ????????? ???? ??????????  ?? ???????? ????????? ?? ??? ????, ??????? ???????? ???????? ????-?????????? ?????? ????? ???????????  ??????? ?? ???? ????? ???????????  ???? ????????     [email or web address]  ???????? ????????? I453 ?????? ?? ????? ??? ?????? "??????? ???????????? 14 ???? 18 ?????????: ??????? ???????????."  ?? ????? ???: 2020 10 08??????????????????????????????????????? ?????: 12.9  ?? 2006-2021 Healthwise, Incorporated.   ???????? ?????????????? ?????? ???????? ???????? ??????? ????????? ?????? ????? ??????? ??? ???????? ?? ???????? ?????? ?? ?? ?????????? ?????? ????????? ??? ???, ???? ????? ????????? ?????? ????? ????????? ??????????? Healthwise, Incorporated, ?? ??????? ?? ????????? ???????? ???? ???? ??? ???????? ?? ?????????? ???????? ??????

## 2020-04-21 NOTE — Progress Notes (Signed)
Pt here for OB visit.  No LOF or bleeding.  No contractions.   History reviewed - see episode report  No nausea or vomiting.    PE:  See vitals  Pt A&OX3, NAD  Normal affect  Non labored breathing  Abd - non tender  Ext - +1 b/l edema    AFP today  GCT today  RTO 4 weeks  Discussion with patient re: obtaining prior C/D op note.  She does not have the name and or address of hospital where she delivered.  She is encouraged to try to get this information and let us know.   C/O hearburn/GERD so Rx pepcid sent in today, diet discussed

## 2020-04-21 NOTE — Progress Notes (Signed)
Patient is here for 1 hr glucose and AFP blood draw. Patient had blood drawn from left antecubital. Patient tolerated procedure well.  Terri Summers, [place]

## 2020-04-21 NOTE — Addendum Note (Signed)
Addended by: Cheral Bay on: 04/21/2020 11:06 AM     Modules accepted: Orders

## 2020-04-22 NOTE — Progress Notes (Signed)
04/21/20  Time: 10:15 -  11:15 am (15 min x 4)  [name] (28; DOB-03/05/1991; EDD-10/12/20) was unaccompanied. Pt is LEP, Nepali. Used [employer] phone for interpreter svc (ID [id number]). I wore a N-95 mask for the entirety of this encounter and pt wore mask as well.  CHIEF COMPLAINT: F/u Soc Svc visit to complete psycho/social assessment. Spent session determining current needs as well as assessing f/u to previous referrals, and completing soc svc assessment to determine behavioral, social and psychological wellbeing.  Discussed: pregnancy / legal aid referral / child support / tax problems /  Advertising copywriter / Electrical engineer.     PRESENTATION - Pt was pleasant, engaged, open and accepting of new ideas, suggestions and referrals. Pt expressed an appropriate range of affect.  ASSESSMENT/ASSISTANCE/REFERRALS CONT. FROM 03/29/20:  ??? Pregnancy - pt states pregnancy is healthy with no known complications and pt expresses no concerns.  ??? Transportation - No change; Pt continues to be driven by FOB yet remains open to alternative transportation options through [facility] plan. Reports FOB is taking off work to drive pt and that this does not seem to be a problem for the employer, but since he has been with the company for over 10 yr, they are fine with him taking time off.   ??? LEGAL - Update: pt has missed some calls while at work and thinks she has missed a call from Legal Aid to assist with filing tax credit for her daughter and filing for child support from daughter???s father.         ASSISTANCE-----attempted to call Legal Aid during pt???s visit, but the paralegal, Dannielle Karvonen, did not answer. Left VM and asked that pt be called ASAP.   ??? Medicaid -  Update; currently covered by [facility]. Reviewed of the following plan benefits:  - Transportation: pt is aware and has not needed program.  - Pregnancy Reward Program - pt has not felt well and decided to call at a later time for registration.   ??? WIC  - currently enrolled at [address]  ??? Food Assistance/Cash Assistance - reports only her parents are rcv food assistance (SNAP). Pt has applied but was denied, over income. Pt thinks they made an error, since they used Gross income vs Net income, in making the calculations. Pt states that she was told by JFS that she would be eligible for cash assistance and food assistance, if she stops working. Pt has decided to continue working after baby is born, since she makes more income   INTERVENTION -   - SNAP (food assistance program) - explained to pt that she could request an appeal of her denial, through JFS. Also, explained JFS???s guidelines are to use Gross income in making the determination, but could still appeal. Suggested she look at the denial form, and there will be instruction for appeal. Also suggested telling Legal Aid about the denial, and they may be able to assist further. Reminded pt that after baby is born, the income guideline will increase, with a 3rd household member, which could help pt to be approved.   - [employer] - agreed with pt that the dollar amount is likely lower than pt makes working. Also, suggested pt to apply for Cash Assistance while on parental leave.  REF -----  food hotline, food pantries and hot meal programs in zip code [address].  PT STATED GOALS (Individual Education Plan)  -   1. Maintain health and wellness  during pregnancy and PP by complying with OB care  2. Maintain nutrition and  financial stability  during pregnancy by accessing assistance resources  3. Obtain basic infant care items.  4. Maintain stable housing.  5. Reapply for JFS Food Assistance (SNAP)  6. Apply for [employer] Assistance during parental leave.  7. Maintain employment  8. Prevent depression/PPD by continuing to access OB care and healthy supports and practice self-care.  [place] INTERVENTION / ACTION TAKEN -   1. Re-assessment - of patient???s condition was performed through the use of interview, pt  self-report.  2. Resources/Services - Informed and referred pt to community support resources, services, and programs as indicated.  3. MH/PPD - confirmed ongoing emotional regulation during pregnancy. This wkr has no current concerns of risk to self, SI or HI.  4. Ongoing Support - Encouragement and support for efforts made to continue with healthy living, accessing needed resources for self and family, and following through with referrals.  PLAN - Appt sched for 05/12/20 @ 11am. This wkr has no current concerns of risk to self, SI or HI. Offered for pt to call or resched if need further assistance and support between sessions. Cathlyn Parsons, MSW, LISW-S

## 2020-04-24 LAB — ALPHA FETOPROTEIN MATERNAL SCREEN
AFP (Alpha Fetoprotein): 34 ng/mL
AFP Mom: 1.02 NA
AGE AT EDC: 30.3 yr
Gestational Age: 15 NA
Maternal Screen Interpretation: NEGATIVE NA
Maternal Weight: 129 NA

## 2020-05-12 ENCOUNTER — Inpatient Hospital Stay: Admit: 2020-05-12 | Attending: Nurse Practitioner

## 2020-05-12 ENCOUNTER — Encounter: Payer: MEDICAID | Attending: Clinical

## 2020-05-12 ENCOUNTER — Ambulatory Visit: Admit: 2020-05-12 | Discharge: 2020-05-12 | Payer: MEDICAID | Attending: Nurse Practitioner

## 2020-05-12 DIAGNOSIS — Z3482 Encounter for supervision of other normal pregnancy, second trimester: Secondary | ICD-10-CM

## 2020-05-12 NOTE — Progress Notes (Signed)
Chaperone not requested       Patient is here for blood draw. Patient had AFP drawn from right  antecubital. Patient tolerated procedure well.

## 2020-05-12 NOTE — Patient Instructions (Signed)
Patient Education        Weeks 18 to 22 of Your Pregnancy: Care Instructions  Overview     Your baby is continuing to develop quickly.  Sometime between 18 and 22 weeks, you'll probably start to feel your baby move. At first, these small fetal movements feel like fluttering or "butterflies." Some women say that they feel like gas bubbles. As your baby grows, these movements will become stronger. You may also notice that your baby hiccups. Babies at this stage can now suck their thumbs.  You may find that your nausea and fatigue are gone. You may feel better overall and have more energy than you did in your first trimester. But you might now also have some new discomforts, like sleep problems or leg cramps. Talk to your doctor about things you can do at home to ease these problems.  Follow-up care is a key part of your treatment and safety. Be sure to make and go to all appointments, and call your doctor if you are having problems. It's also a good idea to know your test results and keep a list of the medicines you take.  How can you care for yourself at home?  Ease sleep problems  ?? Avoid caffeine in drinks or chocolate late in the day.  ?? Get some exercise every day.  ?? Take a warm shower or bath before bed.  ?? Have a light snack or glass of milk at bedtime.  ?? Do relaxation exercises in bed to calm your mind and body.  ?? Support your legs and back with extra pillows. Try a pillow between your legs if you sleep on your side.  ?? Do not use sleeping pills or alcohol. They could harm your baby.  Ease leg cramps  ?? Do not massage your calf during the cramp.  ?? Sit on a firm bed or chair. Straighten your leg, and bend your foot (flex your ankle) slowly upward, toward your knee. Bend your toes up and down.  ?? Stand on a cool, flat surface. Stretch your toes upward, and take small steps walking on your heels.  ?? Use a heating pad or hot water bottle to help with muscle ache.  Prevent leg cramps  ?? Be sure to get enough  calcium. If you are worried that you are not getting enough, talk to your doctor.  ?? Exercise every day, and stretch your legs before bed.  ?? Take a warm bath before bed, and try leg warmers at night.  Where can you learn more?  Go to [email or web address] and sign in to your MyChart account. Enter W603 in the Search Health Information box to learn more about "Weeks 18 to 22 of Your Pregnancy: Care Instructions."     If you do not have an account, please click on the "Sign Up Now" link.  Current as of: August 06, 2019??????????????????????????????Content Version: 12.9  ?? 2006-2021 Healthwise, Inc[employer].   Care instructions adapted under license by [facility]. If you have questions about a medical condition or this instruction, always ask your healthcare professional. Healthwise, Incorporated disclaims any warranty or liability for your use of this information.

## 2020-05-12 NOTE — Progress Notes (Signed)
Pt here for OB visit.  The baby is active.   No LOF or bleeding.  No contractions.   History reviewed - see episode report  No nausea or vomiting.    PE:  See vitals  Pt A&OX3, NAD  Normal affect  Non labored breathing  Abd - non tender  Ext - +1 b/l edema

## 2020-05-15 LAB — ALPHA FETOPROTEIN MATERNAL SCREEN
AFP (Alpha Fetoprotein): 47 ng/mL
AFP Mom: 0.92 NA
AGE AT EDC: 30.3 yr
Gestational Age: 18 NA
Maternal Screen Interpretation: NEGATIVE NA
Maternal Weight: 128 NA

## 2020-05-23 ENCOUNTER — Ambulatory Visit
Admit: 2020-05-23 | Discharge: 2020-05-23 | Payer: MEDICAID | Attending: Student in an Organized Health Care Education/Training Program

## 2020-05-23 DIAGNOSIS — O4400 Placenta previa specified as without hemorrhage, unspecified trimester: Secondary | ICD-10-CM
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
## 2020-05-23 NOTE — Assessment & Plan Note (Signed)
Nepali interpreter used

## 2020-05-23 NOTE — Assessment & Plan Note (Signed)
Anterior Placenta Previa noted on 05/12/20 with suspicion for placenta accreta.     discussed implications of previa and suspected accreta. Discussed precautions on when to present to triage including vaginal bleeding, LOF, abd pain. Discussed recommendations for pelvic rest - nothing in the vagina (tampons/douching/sexual intercourse). Repeat TVUS to evaluate placenta at 24 and 28 weeks.

## 2020-05-23 NOTE — Assessment & Plan Note (Addendum)
Suspected based on Anatomy US 05/12/20    Plan for 24 week and 28 week TVUS to evaluate placenta; if still appears to be accreta, plan for pelvic MRI at [facility] to assess as well as referral to GYN Oncology for pre-delivery consult. This was thoroughly discussed with patient today and that if accreta present at delivery, she would have a cesarean hysterectomy performed. Discussed the implications of this on her future fertility and that she would not be able to bear any further children herself but that a gestational carrier using her eggs could be an option.

## 2020-05-23 NOTE — Assessment & Plan Note (Signed)
No complaints today. Feeling some FM. Denies LOF, VB, Abd pain/Ctx.

## 2020-05-23 NOTE — Assessment & Plan Note (Signed)
Does not recall indication for prior cesarean section. States she was having natural labor, then was rushed to the OR. Suspect Cat II FHT.

## 2020-06-09 ENCOUNTER — Ambulatory Visit: Admit: 2020-06-09 | Discharge: 2020-06-09 | Payer: MEDICAID | Attending: Nurse Practitioner

## 2020-06-09 ENCOUNTER — Encounter: Payer: MEDICAID | Attending: Clinical

## 2020-06-09 DIAGNOSIS — Z3482 Encounter for supervision of other normal pregnancy, second trimester: Secondary | ICD-10-CM

## 2020-06-09 NOTE — Progress Notes (Addendum)
Discussed with patient and husband US findings of previa and possible accreta.  Questions answered.  She will have follow up US for testing.  This is already ordered and scheduled.   She will be monitored closely by physicians for delivery plan.   Husband states he and patient want uterus saved if possible  They verbalized understanding that if accreta is confirmed, hysterectomy is likely indicated and needed.  Patient and husband verbalized understanding.

## 2020-06-09 NOTE — Patient Instructions (Signed)
Patient Education        ??????? ???????????? 22 ???? 26 ?????????: ??????? ???????????  Weeks 22 to 26 of Your Pregnancy: Care Instructions  ??????? ??????? ???????????     ????? 26 ??????? ???????????? 7th ??????? ???????, ??????? ??????? ?????? ????????? ? ????? ????? ???? ??????? ?????? ??????? ?? ????? ????????? ?????? ??????? ??????? ??????? ??????????? ????? ????? ???? ??????????? ??????? ????? ?????? ? ??????? ???? ? ????????? ?? ???? ???? ???? ????? ??? ??????? ????? ???? ??????????? ?????: ???? ???? ????? ??????? ???????? ???????? ?????? ??? ???????? ???????? ????? ?????? ? ??????? ????? ??????  ????? ????? ????? ??????? ??????? ???? ???? ????? ????? ??????????? ??????? ??????? ?????? ??????? ???? ??? ??????????? ?? ?????? ????? ???? ????? ???? ???? ??? ?? ?????? ??? ???  ??????? ??????????? 24 ? 28 ??????? ????? ?????????? ?????? ???????? ???? ??????? ??????? ????? ??????? ????? ???? ?????? ???? ???? ???? ??? ???, ?????????? ?????? ?????? ?????? ??????? ???????????? ?????, ?????? ???????? ?????????? ?????? ?????? ??? ???? ????????? ?? ???????? ??????? ???????? ????????? ?????????? ?????  ???-?? ?????? ??????? ????? ??? ????????? ????? ??? ??? ??? ????????????? ??? ??? ????? ??????? ????????? ???? ??? ???????? ????????? ??????? ? ??? ????? ????????? ?? ?????????? ????? ?????? ????????? ???? ????? ??? ????? ???? ????????? ???? ?????? ??? ?? ?????? ????? ???  ??????? ???? ????? ??????? ???? ???? ???????????  ????? ??????? ?????? ???? ??????? ?? ??????????  ?? ????? ?????? ??????????? ?????????? ???? ??????? ??????? ????????? ??? ???????? ?????? ??  ?? ??????? ????? ??? ????? ????????? ??????? ??? ????? ???????????? ??????? ??????? ????? ??? ?????? ??? ??????????  ????? ???????? ??????? ???? ????? ?????????? ??????????  ?? ??????? ?????? ???? ????? ??? ????? ??????? ???? ???????????  ? ???? ?????????????? ????????????? ??? ??????? ????? ???????? ????? ?????? ????????? ????? ??????? ??? ? ????????? ?????  ???????  ? 3 ??????????? ???????? ?????????? ? ??????? 3 ????????? ???? ????? ???????????  ? 3 ?????????? ???? ?????????? ??????? 10 ??????????? ???????? ????? ???????????? ???????? ??????? 1 ??????? ?? ??????????  ? ???????? ?????? ???? ?????????? 10 ???? 15 ??????? ????????????????? ???????? ??? ?????? ?? ?? ????? ??? ??????? ??????????  ??????? ??????, ?????????, ??? ? ??????? ???????? ?? ????? ?? ???? ????? ???? ??  ?? ??????? ??????? ???? ??? ???, ????? ??????? ???????????  ?? ????? ????? ?????, ????? ??? ???? ????? ??????????  ?? ??????? ????? ????? ????????? ???? ?? ??????? ???? ??????????? ???????? ????????? ????? ???????????  ?? ???? ??????? ?????????? ???? ??????? ????????? ????????????  ?? ?????? ???? ??????????? ???????????  ??????? ?? ???? ????? ???????????  ???? ????????   [email or web address]  ???????? ????????? G264 ?????? ?? ????? ??? ?????? "??????? ???????????? 22 ???? 26 ?????????: ??????? ???????????."  ?? ????? ???: 2020 10 08??????????????????????????????????????? ?????: 12.9  ?? 2006-2021 [employer]wise[employer].   ???????? ?????????????? ?????? ???????? ???????? ??????? ????????? ?????? ????? ??????? ??? ???????? ?? ???????? ?????? ?? ?? ?????????? ?????? ????????? ??? ???, ???? ????? ????????? ?????? ????? ????????? ??????????? [employer]wise[employer] Inc[employer], ?? ??????? ?? ????????? ???????? ???? ???? ??? ???????? ?? ?????????? ???????? ??????

## 2020-06-09 NOTE — Progress Notes (Signed)
Pt here for OB visit.  The baby is active.   No LOF or bleeding.  No contractions.   History reviewed - see episode report  No nausea or vomiting.    PE:  See vitals  Pt A&OX3, NAD  Normal affect  Non labored breathing  Abd - non tender  Ext - +1 b/l edema

## 2020-06-10 NOTE — Telephone Encounter (Signed)
-----   Message from Simon Rhein, DO sent at 05/23/2020  3:50 PM EDT -----  Patient needs Growth [place] every 4 weeks and repeat transvaginal US to evaluate placenta (previa with concern for accreta) starting at 24 weeks; repeat transvaginal US at 28 weeks too.

## 2020-06-12 NOTE — Telephone Encounter (Signed)
Patient was seen by me on 06-09-2020  She will see physicians only until delivery due to placenta issue, cancel any future visits with me

## 2020-06-14 NOTE — Telephone Encounter (Signed)
PT is scheduled for [place] 8/25 at 1130, called PT to inform VM not set up. Will mail and continue to call.

## 2020-06-14 NOTE — Telephone Encounter (Signed)
Done, scheduled with [facility] 8/24

## 2020-06-16 ENCOUNTER — Encounter: Payer: MEDICAID | Attending: Clinical

## 2020-06-17 ENCOUNTER — Encounter: Payer: MEDICAID | Attending: Clinical

## 2020-06-21 ENCOUNTER — Encounter: Payer: MEDICAID | Attending: Student in an Organized Health Care Education/Training Program

## 2020-06-21 NOTE — Telephone Encounter (Signed)
Attempted to call pt to r/s missed OB visit this AM with [facility]. No answer and VM not set up. I will call ultrasound tomorrow, pt is scheduled for 11:30, and if she comes ask them to send her down following ultrasound.

## 2020-06-22 ENCOUNTER — Ambulatory Visit

## 2020-06-23 NOTE — Telephone Encounter (Signed)
Pt did not make it to u/s on 8/26. Was not able to get ahold of pt.

## 2020-07-07 ENCOUNTER — Encounter: Attending: Nurse Practitioner

## 2020-07-20 ENCOUNTER — Inpatient Hospital Stay: Admit: 2020-07-20 | Attending: Obstetrics & Gynecology

## 2020-07-22 NOTE — Telephone Encounter (Signed)
Patient has not been seen since 06/09/20 and does not have f/u appt scheduled. She is an OB patient with suspected placenta accreta. Most recent US shows low lying placenta, not a placenta previa and less concern for accreta than previous US. However, she should still be seen soon. It looks like she no-showed to her appt on 8/24 and then never was rescheduled. Can she be scheduled for high risk clinic on Monday 07/25/20? She is [place] speaking with limited [place] proficiency. Thank you!

## 2020-07-22 NOTE — Telephone Encounter (Signed)
Pt scheduled 9/27 at 9 am

## 2020-07-25 ENCOUNTER — Ambulatory Visit
Admit: 2020-07-25 | Discharge: 2020-07-25 | Payer: MEDICAID | Attending: Student in an Organized Health Care Education/Training Program

## 2020-07-25 DIAGNOSIS — O0990 Supervision of high risk pregnancy, unspecified, unspecified trimester: Secondary | ICD-10-CM

## 2020-07-25 MED ORDER — FAMOTIDINE 20 MG PO TABS
20 MG | ORAL_TABLET | Freq: Two times a day (BID) | ORAL | 6 refills | Status: DC
Start: 2020-07-25 — End: 2020-09-13

## 2020-07-25 MED ORDER — ALBUTEROL SULFATE HFA 108 (90 BASE) MCG/ACT IN AERS
108 (90 Base) MCG/ACT | Freq: Four times a day (QID) | RESPIRATORY_TRACT | 0 refills | Status: DC | PRN
Start: 2020-07-25 — End: 2020-08-30

## 2020-07-25 NOTE — Assessment & Plan Note (Addendum)
Discussed recommendation for COVID vaccination in all pregnant patients and educational materials on vaccines and scheduling provided today  Received Tdap today  Discussed PPBC plan and desires Nexplanon  1 hr GCT ordered today  Discussed plans to breast vs bottle feed and plans to breast feed

## 2020-07-25 NOTE — Patient Instructions (Signed)
COVID-19, COVID-19 Vaccine and Pregnancy    What is COVID-19?  ??? COVID-19 is a new illness that affects the lungs and breathing. It is caused by a new coronavirus. Symptoms include fever, cough, and trouble breathing. It also may cause stomach problems, such as nausea and diarrhea, and a loss of your sense of smell or taste. Symptoms may appear 2 to 14 days after you are exposed to the virus. Some people with COVID-19 may have no symptoms or only mild symptoms.    How does COVID-19 affect pregnant and recently pregnant women?  ??? Researchers are still learning how COVID-19 affects pregnant and recently pregnant women. Current reports suggest that pregnant and recently pregnant women have a higher risk for more severe illness from COVID-19 than nonpregnant women. Reports note that:    ??? Pregnant women who have COVID-19 and show symptoms are more likely than nonpregnant women with COVID-19 and symptoms to need care in an intensive care unit (ICU), to need a ventilator (for breathing support), or to die from the illness. Still, the overall risk of severe illness and death for pregnant women is low.    ??? Pregnant and recently pregnant women with some health conditions, such as obesity and gestational diabetes, may have an even higher risk of severe illness, similar to nonpregnant women with these conditions.    ??? Pregnant women who are Black or Hispanic have a higher rate of illness and death from COVID-19 than other pregnant women, but not because of biology. Black and Hispanic women are more likely to face social, health, and economic inequities that put them at greater risk of illness. To learn more about these inequities, see this page from the Centers for Disease Control and Prevention (CDC).    How can COVID-19 affect a fetus?  ??? Remember that researchers are learning more about COVID-19 all the time. Some researchers are looking specifically at COVID-19 and its possible effects on a fetus. Here???s what they know  now:  o Researchers have found a few cases of COVID-19 that may have passed to a fetus during pregnancy, but this seems to be rare.  o Researchers have studied COVID-19 infection, preterm birth, and stillbirth. Some studies suggest there may be an increased risk of preterm birth and stillbirth for women with COVID-19. Other studies have not found this to be true. But information is still limited. Researchers are continuing to study these outcomes to better understand the effects of COVID-19 before birth.  o After birth, a newborn can get the virus if they are exposed to it.    Should I get a COVID-19 vaccine during pregnancy?  ??? Yes! You should get a COVID-19 vaccine during pregnancy. ACOG recommends that all pregnant women be vaccinated against COVID-19. Getting a vaccine could help both you and your fetus. Remember that pregnant women have a higher risk of severe illness from COVID-19 than nonpregnant women. The vaccines are very effective at preventing COVID-19 infection, severe illness, and death.    ??? A growing amount of data confirms that COVID-19 vaccines are safe during pregnancy. Scientists have compared the pregnancies of women who have received COVID-19 vaccines and women who have not. The reports show that these women have had similar pregnancy outcomes. Data do not show any safety concerns.    ??? If you are pregnant and want to know more about the vaccines, you can talk with your obstetrician-gynecologist (ob-gyn) or other health care professional. This conversation is not required to get a   vaccine, though it may be helpful. You can discuss your risk of getting COVID-19 and your risk of severe illness if you get sick.    Should breastfeeding women get a COVID-19 vaccine?  ??? Yes, ACOG recommends that breastfeeding women get a COVID-19 vaccine. There is no need to stop breastfeeding if you want to get a vaccine. When you get vaccinated, the antibodies made by your body may be passed through breastmilk and  may help protect your child from the virus.    Should I get a COVID-19 vaccine if I am trying to get pregnant?  ??? Yes, if you are planning or trying to get pregnant, you should get a COVID-19 vaccine. There is no evidence that the COVID-19 vaccines cause infertility. You also do not need to delay getting pregnant after you get a vaccine.    ??? Some COVID-19 vaccines will require two doses. If you find out you are pregnant after you have the first dose, you should still get the second dose.    What are the differences between the vaccines?  ??? Two vaccines require two shots [employer] [place]), and one vaccine requires only one shot [employer]). All available COVID-19 vaccines are proven to be safe and highly effective. Pregnant and nonpregnant people can choose to get any of the available vaccines. Learn more from the [facility] about the different vaccines.    What should I know about the safety of the [employer] vaccine?  ??? The [employer] COVID-19 vaccine is safe and effective. All vaccines have gone through intense safety studies and health officials continue to track their safety.    ??? The [employer] vaccine does have potential risks, but they are very rare. The vaccine has been linked to two rare health conditions:    ??? A condition involving blood clots and other symptoms. Most cases of these blood clots have been reported in women under age 56. Learn more from the [facility].    ??? Guillain-Barr?? syndrome, an immune system disorder that affects the nervous system.    ??? These conditions have only been reported in a few people out of every million doses of the [employer] vaccine that have been given. Scientists reviewed these reports and decided that the benefits of the vaccine are greater than these risks.    ??? If you are offered the [employer] vaccine, you should be aware of the potential risks of these rare health conditions. If you choose not to receive the [employer]  vaccine, you can receive another available vaccine.    If I decide to get a COVID-19 vaccine, what should I expect?  ??? It is common to feel side effects after getting a COVID-19 vaccine. There are different types of COVID-19 vaccines that have varying side effects. Side effects also vary from person to person. Some vaccines may make you feel like you have the flu for a few days. This is normal.    ??? If you have a fever or other side effects after getting the vaccine, you can take acetaminophen, an over-the-counter medication that is safe during pregnancy. If you are worried about your side effects or they last more than a few days, talk with your ob-gyn or other health care professional.    I have heard rumors about how the vaccines can affect my body. What is the truth?  ??? The vaccines that have been approved so far work in different  ways, and all of them are proven to be safe. It is important to know that:    ??? The vaccines cannot give you COVID-19. None of the vaccines use the live virus that causes COVID-19.    ??? The vaccines do not affect your genes or DNA.    ??? There is no evidence that the COVID-19 vaccines cause infertility. ACOG recommends vaccination for anyone who may consider getting pregnant in the future.    ??? Visit the CDC???s website for the latest information on COVID-19 vaccines.      [email or web address]

## 2020-07-25 NOTE — Assessment & Plan Note (Signed)
Albuterol refilled today

## 2020-07-25 NOTE — Telephone Encounter (Signed)
Noted, I will work on it.

## 2020-07-25 NOTE — Telephone Encounter (Signed)
-----   Message from Becky Sax, DO sent at 07/25/2020 10:01 AM EDT -----  Regarding: Terri Summers scheduling  Hello!    Patient needs growth Terri Summers scheduled in 3 weeks, ~10/20.     Thank you!    Dahlia Client

## 2020-07-25 NOTE — Progress Notes (Signed)
Reason for Visit:   Patient Active Problem List   Diagnosis   ??? Non-English speaking patient   ??? Previous cesarean delivery affecting pregnancy   ??? History of macrosomia in infant in prior pregnancy, currently pregnant   ??? Placenta previa antepartum   ??? Placenta accreta, antepartum   ??? High-risk pregnancy supervision, unspecified trimester   ??? Mild intermittent asthma without complication          CHIEF COMPLAINT:   Chief Complaint   Patient presents with   ??? Routine Prenatal Visit     19w5days, +FM       Terri Summers is a 29 y.o. female G2P1001     At Gestational Age:  [redacted]w[redacted]d   Estimated Date of Delivery: 10/12/20    HPI:  Denies CTX/VB/LOF/DFM. C/o continued GERD for the last 2 months. Pepcid helps but ran out of refills.    OB History     Gravida   2    Para   1    Term   1    Preterm   0    AB   0    Living   1       SAB   0    TAB   0    Ectopic   0    Molar   0    Multiple   0    Live Births   1                  Past Medical History:   Diagnosis Date   ??? Mild intermittent asthma without complication 07/25/2020       Past Surgical History:   Procedure Laterality Date   ??? CESAREAN SECTION  2016       Current Outpatient Medications   Medication Sig Dispense Refill   ??? famotidine (PEPCID) 20 MG tablet Take 1 tablet by mouth 2 times daily 60 tablet 6   ??? albuterol sulfate HFA (VENTOLIN HFA) 108 (90 Base) MCG/ACT inhaler Inhale 2 puffs into the lungs 4 times daily as needed for Wheezing 18 g 0   ??? Prenatal Vit-Fe Fumarate-FA (PRENATAL VITAMIN) 27-1 MG TABS tablet Take 1 tablet by mouth daily 30 tablet 11   ??? pyridoxine (B-6) 25 MG tablet Take 1 tablet by mouth every 6 hours (Patient not taking: Reported on 04/21/2020) 90 tablet 3   ??? doxyLAMINE succinate (GNP SLEEP AID) 25 MG tablet Take 1 tablet by mouth nightly (Patient not taking: Reported on 03/24/2020) 30 tablet 3     No current facility-administered medications for this visit.       REVIEW OF SYSTEMS:  CONSTITUTIONAL :  Well developed, well nourished  female  HEENT: negative   RESPIRATORY: negative   CARDIOVASCULAR: negative   GASTROINTESTINAL: negative  GENITOURINARY: negative   ENDOCRINE: negative   MUSCULOSKELETAL: negative    PHYSICAL EXAM:   Vitals:    07/25/20 0934   BP: 108/72   Pulse: 89   Temp: 97.3 ??F (36.3 ??C)   Weight: 139 lb (63 kg)        FHR: 150 bpm  Fundal height: 28cm    ASSESSESMENT:      Diagnosis Orders   1. High-risk pregnancy supervision, unspecified trimester  Glucose Tolerance, 1 Hr    Tdap (age 18y and older) IM (BOOSTRIX)   2. Previous cesarean delivery affecting pregnancy     3. Placenta previa antepartum     4. Placenta accreta, antepartum  US OB  Follow Up Transabdominal Approach   5. Gastroesophageal reflux disease without esophagitis  famotidine (PEPCID) 20 MG tablet   6. Mild intermittent asthma without complication  albuterol sulfate HFA (VENTOLIN HFA) 108 (90 Base) MCG/ACT inhaler       PLAN:    High-risk pregnancy supervision, unspecified trimester  Discussed recommendation for COVID vaccination in all pregnant patients and educational materials on vaccines and scheduling provided today  Received Tdap today  Discussed PPBC plan and desires Nexplanon  1 hr GCT ordered today  Discussed plans to breast vs bottle feed and plans to breast feed    Previous cesarean delivery affecting pregnancy  Discussed route of delivery and desires TOLAC  Will wait to sign consent until repeat US completed for placental evaluation    Placenta accreta, antepartum  Repeat US 9/22 showed low lying placenta with low concern for accreta  Plan for repeat US in 4 wk    Mild intermittent asthma without complication  Albuterol refilled today      Return in about 2 weeks (around 08/08/2020) for Return OB - okay for low risk clinic.    Antenatal testing: q4wk growth [place]  Timing of delivery: TBD based on results of next [place]  Mode of delivery: TBD  Contraception plan: Nexplanon  Breastfeeding: yes        Diagnosis Orders   1. High-risk pregnancy supervision,  unspecified trimester  Glucose Tolerance, 1 Hr    Tdap (age 79y and older) IM (BOOSTRIX)   2. Previous cesarean delivery affecting pregnancy     3. Placenta previa antepartum     4. Placenta accreta, antepartum  US OB Follow Up Transabdominal Approach   5. Gastroesophageal reflux disease without esophagitis  famotidine (PEPCID) 20 MG tablet   6. Mild intermittent asthma without complication  albuterol sulfate HFA (VENTOLIN HFA) 108 (90 Base) MCG/ACT inhaler       Dolly Rias, DO

## 2020-07-25 NOTE — Assessment & Plan Note (Addendum)
Discussed route of delivery and desires TOLAC  Will wait to sign consent until repeat US completed for placental evaluation

## 2020-07-25 NOTE — Assessment & Plan Note (Signed)
Repeat US 9/22 showed low lying placenta with low concern for accreta  Plan for repeat US in 4 wk

## 2020-07-25 NOTE — Telephone Encounter (Signed)
I will get ultrasounds scheduled.

## 2020-07-25 NOTE — Telephone Encounter (Signed)
Pt is already scheduled for growth ultrasound 10/20 at 1130 from [place].

## 2020-08-02 NOTE — Telephone Encounter (Signed)
Name of caller requesting page:[name]     Phone Number of caller: [phone]    Facility requesting page: [facility]     Reason for Page: doctor to doctor    Provider paged: Dr. Gordy Clement    Practice Name of paged provider: [facility]     Page Placed to #: n/a    Time Page was sent or provider contacted: 1:32 am    Method of Contact: ps    Page Content: 08/02/20 1:32 AM   [phone] ext [phone] From: [name] RE: Terri Summers Mar 18, 1991 Location: [place] RM: 000 [name] at [employer] [phone] said Dr. Vella Kohler would like a doctor to doctor for pt. Terri Summers May 16, 1991. Callback Required: NEEDS CALLBACK SC ROUTINE  Unread

## 2020-08-09 ENCOUNTER — Encounter: Payer: MEDICAID | Attending: Student in an Organized Health Care Education/Training Program

## 2020-08-16 ENCOUNTER — Ambulatory Visit
Admit: 2020-08-16 | Discharge: 2020-08-16 | Payer: MEDICAID | Attending: Student in an Organized Health Care Education/Training Program

## 2020-08-16 DIAGNOSIS — K219 Gastro-esophageal reflux disease without esophagitis: Secondary | ICD-10-CM

## 2020-08-16 MED ORDER — CALCIUM CARBONATE ANTACID 500 MG PO CHEW
500 MG | ORAL_TABLET | Freq: Every day | ORAL | 1 refills | Status: AC
Start: 2020-08-16 — End: 2020-09-15

## 2020-08-16 NOTE — Assessment & Plan Note (Signed)
Currently planning for RCD given anterior low lying placenta

## 2020-08-16 NOTE — Assessment & Plan Note (Signed)
Well controlled on albuterol inhaler PRN.

## 2020-08-16 NOTE — Telephone Encounter (Signed)
Pt is scheduled for 10/20 at 420. Pt wants tor reschedule, gave her the phone number to call.

## 2020-08-16 NOTE — Telephone Encounter (Signed)
-----   Message from Weldon Picking, MD sent at 08/16/2020 12:08 PM EDT -----  Regarding: LE Duplex - Urgent  Please schedule patient for LE dopplers ASAP. Thanks

## 2020-08-16 NOTE — Telephone Encounter (Signed)
Noted, I will work on it.

## 2020-08-16 NOTE — Progress Notes (Signed)
Assessment and Plan:   High-risk pregnancy supervision, unspecified trimester  Doing well, no OB complaints  Patient declines 1hr GTT today, states that she will do it at next visit  Patient reports GERD symptoms not improved with Pepcid, will prescribe Tums  Patient also reports LE pain, sometimes bilateral but today L worse than R. On exam, legs are equal in circumference, no significant tenderness on palpation, negative Homan's sign, no erythema so low suspicion for DVT but will get LE doppler. Recommend patient go to [facility] triage for evaluation but patient adamantly refuses as partner needs to get to work. Will attempt to arrange to LE duplex as an outpatient but discussed the implications of an undiagnosed PE.     Placenta previa antepartum  Low lying placenta (1.34mm from os). Patient understands pelvic rest. Will continue q4w growths.     Mild intermittent asthma without complication  Well controlled on albuterol inhaler PRN.     Previous cesarean delivery affecting pregnancy  Currently planning for RCD given anterior low lying placenta       Chief Complaint   Patient presents with   ??? Routine Prenatal Visit     not taking prenatals-states pharmacy does not have it despite 11 refills-also needs inhaler refill        29 y.o. yo G2P1001 at [redacted]w[redacted]d  Here today for ROB  No acute complaints. Denies DFM/VB/CTX/LOF    ROS:  No pelvic pain     PE:  Vitals:    08/16/20 1022   BP: 108/68   Pulse: 101   Temp: 97.3 ??F (36.3 ??C)     There is no height or weight on file to calculate BMI.  PE:  Gen: NAD, A&Ox3  HEENT: NC/AT, EOMI  Abd: nontender, uterus palpable on exam  Skin: warm/dry  BLE: without edema, nontender    ASA Indications  High Risk Indication for Daily ASA: N/A  Two Moderate Risk Indication for Daily ASA: Low socioeconomic status  ASA prescribed: No, Not Indicated    Return in about 2 weeks (around 08/30/2020) for Routine OB Visit.

## 2020-08-16 NOTE — Assessment & Plan Note (Signed)
Doing well, no OB complaints  Patient declines 1hr GTT today, states that she will do it at next visit  Patient reports GERD symptoms not improved with Pepcid, will prescribe Tums  Patient also reports LE pain, sometimes bilateral but today L worse than R. On exam, legs are equal in circumference, no significant tenderness on palpation, negative Homan's sign, no erythema so low suspicion for DVT but will get LE doppler. Recommend patient go to [facility] triage for evaluation but patient adamantly refuses as partner needs to get to work. Will attempt to arrange to LE duplex as an outpatient but discussed the implications of an undiagnosed PE.

## 2020-08-16 NOTE — Assessment & Plan Note (Signed)
Low lying placenta (1.79mm from os). Patient understands pelvic rest. Will continue q4w growths.

## 2020-08-17 ENCOUNTER — Inpatient Hospital Stay: Admit: 2020-08-17 | Attending: Obstetrics & Gynecology

## 2020-08-17 ENCOUNTER — Ambulatory Visit

## 2020-08-23 ENCOUNTER — Inpatient Hospital Stay: Attending: Student in an Organized Health Care Education/Training Program

## 2020-08-30 ENCOUNTER — Ambulatory Visit
Admit: 2020-08-30 | Discharge: 2020-08-30 | Payer: MEDICAID | Attending: Student in an Organized Health Care Education/Training Program

## 2020-08-30 DIAGNOSIS — O0990 Supervision of high risk pregnancy, unspecified, unspecified trimester: Secondary | ICD-10-CM

## 2020-08-30 LAB — GLUCOSE TOLERANCE, 1 HOUR: Gluc Chal: 123 mg/dL (ref <140)

## 2020-08-30 MED ORDER — PRENATAL 27-1 MG PO TABS
27-1 MG | ORAL_TABLET | Freq: Every day | ORAL | 11 refills | Status: AC
Start: 2020-08-30 — End: 2021-09-29

## 2020-08-30 MED ORDER — ALBUTEROL SULFATE HFA 108 (90 BASE) MCG/ACT IN AERS
108 (90 Base) MCG/ACT | Freq: Four times a day (QID) | RESPIRATORY_TRACT | 0 refills | Status: DC | PRN
Start: 2020-08-30 — End: 2020-10-19

## 2020-08-30 NOTE — Assessment & Plan Note (Addendum)
Denies OB complaints today. Baby girl, breastfeeding, desires Nexplanon for birth control. Plan for 1 hr GTT today.

## 2020-08-30 NOTE — Progress Notes (Signed)
Patient is here for GCT. Patient drank glucose drink within five minutes. Informed patient, nothing to eat or drink for one hour. After timer goes off in one hour, will draw blood. Patient verbalized understanding. After one hour, glucose was drawn from right antecubital. Patient tolerated procedure well.

## 2020-08-30 NOTE — Progress Notes (Signed)
Assessment and Plan:   High-risk pregnancy supervision, unspecified trimester  Denies OB complaints today. Baby girl, breastfeeding, desires Nexplanon for birth control. Plan for 1 hr GTT today.     Previous cesarean delivery affecting pregnancy  Patient considering TOLAC and will need counseled at next visit after repeat US. Low-lying placenta resolved. Placental edge 4.1 cm from internal os. Patient to have repeat US in 2 weeks.     Placenta previa antepartum  Placenta previa/accreta resolved and not documented on [place] 10/20. Placental edge 4.1 cm from internal os. Patient to have repeat US in 2 weeks. Possible TOLAC candidate if patient desires.       Chief Complaint   Patient presents with   ??? Routine Prenatal Visit     33 weeks 6 days, pt want to refill albuterol sulfate HFA Inhealther  and prenatal vatamin fe .       29 y.o. yo G2P1001 at [redacted]w[redacted]d  Here today for ROB  No acute complaints.  Denies DFM/VB/CTX/LOF    ROS:  No pelvic pain     PE:  Vitals:    08/30/20 0822   BP: 102/76   Pulse: 120   Temp: 97.1 ??F (36.2 ??C)     Body mass index is 28.59 kg/m??.  PE:  Gen: NAD, A&Ox3  HEENT: NC/AT, EOMI  Abd: nontender, uterus palpable on exam  Skin: warm/dry  BLE: without edema, nontender    ASA Indications  High Risk Indication for Daily ASA: N/A  Two Moderate Risk Indication for Daily ASA: Low socioeconomic status  ASA prescribed: No, Not Indicated    Return in about 2 weeks (around 09/13/2020) for ROB.

## 2020-08-30 NOTE — Assessment & Plan Note (Signed)
Placenta previa/accreta resolved and not documented on [place] 10/20. Placental edge 4.1 cm from internal os. Patient to have repeat US in 2 weeks. Possible TOLAC candidate if patient desires.

## 2020-08-30 NOTE — Assessment & Plan Note (Addendum)
Patient considering TOLAC and will need counseled at next visit after repeat US. Low-lying placenta resolved. Placental edge 4.1 cm from internal os. Patient to have repeat US in 2 weeks.

## 2020-09-13 ENCOUNTER — Ambulatory Visit
Admit: 2020-09-13 | Discharge: 2020-09-13 | Payer: MEDICAID | Attending: Student in an Organized Health Care Education/Training Program

## 2020-09-13 DIAGNOSIS — O0990 Supervision of high risk pregnancy, unspecified, unspecified trimester: Secondary | ICD-10-CM

## 2020-09-13 MED ORDER — FAMOTIDINE 20 MG PO TABS
20 MG | ORAL_TABLET | Freq: Two times a day (BID) | ORAL | 6 refills | Status: DC
Start: 2020-09-13 — End: 2020-10-19

## 2020-09-13 NOTE — Assessment & Plan Note (Signed)
Placenta previa/accreta resolved and not documented on [place] 10/20. Placental edge 4.1 cm from internal os. Patient to have repeat US tomorrow. Possible TOLAC candidate if patient desires. TOLAC consent signed today.

## 2020-09-13 NOTE — Progress Notes (Signed)
Assessment and Plan:   High-risk pregnancy supervision, unspecified trimester  Baby girl, planning on breast and bottle feeding, desires Nexplanon. Passed 1 hr GTT. Obtained GBS today. Will repeat US but patient desires TOLAC. Consent signed today. 55.7% chance of TOLAC     Placenta previa antepartum  Placenta previa/accreta resolved and not documented on [place] 10/20. Placental edge 4.1 cm from internal os. Patient to have repeat US tomorrow. Possible TOLAC candidate if patient desires. TOLAC consent signed today.       Chief Complaint   Patient presents with   ??? Routine Prenatal Visit     35 weeks and 6 days pregnancy, no c/o,+FM, [employer] is on line,       29 y.o. yo G2P1001 at [redacted]w[redacted]d  Here today for ROB  No acute complaints. Denies DFM/VB/CTX/LOF    ROS:  No pelvic pain     PE:  Vitals:    09/13/20 0949   BP: 107/73   Pulse: 114   Temp: 97.1 ??F (36.2 ??C)     Body mass index is 29.33 kg/m??.  PE:  Gen: NAD, A&Ox3  HEENT: NC/AT, EOMI  Abd: nontender, uterus palpable on exam  Skin: warm/dry  BLE: without edema, nontender    tolac caluclator and signed forms   ASA Indications  High Risk Indication for Daily ASA: N/A  Two Moderate Risk Indication for Daily ASA: Low socioeconomic status  ASA prescribed: No, Not Indicated    Return in about 1 week (around 09/20/2020).

## 2020-09-13 NOTE — Assessment & Plan Note (Addendum)
Baby girl, planning on breast and bottle feeding, desires Nexplanon. Passed 1 hr GTT. Obtained GBS today. Will repeat US but patient desires TOLAC. Consent signed today. 55.7% chance of TOLAC

## 2020-09-14 ENCOUNTER — Encounter

## 2020-09-14 ENCOUNTER — Inpatient Hospital Stay: Admit: 2020-09-14 | Attending: Obstetrics & Gynecology

## 2020-09-14 NOTE — Other (Unsigned)
Patient Acct Nbr: [id number]   Primary AUTH/CERT:   Primary Insurance Company Name: [facility]  [employer] Plan name: Luther Redo [facility]  Primary Insurance Group Number: [id number]  Primary Insurance Plan Type: Health  Primary Insurance Policy Number: [id number]

## 2020-09-15 LAB — GROUP B STREP,PCR: Group B Strep Screen PCR: NEGATIVE

## 2020-09-21 ENCOUNTER — Ambulatory Visit
Admit: 2020-09-21 | Discharge: 2020-09-21 | Payer: MEDICAID | Attending: Student in an Organized Health Care Education/Training Program

## 2020-09-21 DIAGNOSIS — J4520 Mild intermittent asthma, uncomplicated: Secondary | ICD-10-CM

## 2020-09-21 NOTE — Patient Instructions (Signed)
Pregnancy Your Third Trimester: Care Instructions         As you enter the final months and weeks of pregnancy, the reality of having a baby may start to set in. This is a good time to set up a safe nursery and find quality child care if needed. Doing this stuff ahead of time will allow you to focus on caring for and enjoying your new baby. Your growing baby is putting more pressure on your bladder. So you may need to urinate more often. Hemorrhoids are also common. Pelvic pain and discomfort are unfortunately very common at this stage. But it is important to know when to call your doctor, or come to labor and delivery.    Learn about preterm labor  1. Watch for signs of preterm labor. You may be going into labor if:  ? You have about 6 or more contractions in 1 hour, even after you have had a glass of water and are resting.  ? You have a low, dull backache that does not go away when you change your position.  ? You have pain or pressure in your pelvis that comes and goes in a pattern.  ? You have intestinal cramping or flu-like symptoms, with or without diarrhea.  ? You notice an increase or change in your vaginal discharge. Discharge may be heavy, mucus-like, watery, or streaked with blood.  ? Your water breaks.  2. If you think you have preterm labor:  ? Drink 2 or 3 glasses of water or juice. Not drinking enough fluids can cause contractions.  ? Stop what you are doing, and empty your bladder. Then you can lie down on your left side.  ? Contractions can be weak or strong. Record your contractions for an hour. Time a contraction from the start of one contraction to the start of the next one.  ? Single or several strong contractions without a pattern are called Braxton-Hicks contractions. They are practice contractions but not the start of labor. They often stop if you change what you are doing, rest, or hydrate  ? Call your doctor if you have regular contractions.    Pay attention to your baby's movements  ??? You  should feel your baby move several times every day.  ??? Your baby now turns less, and kicks and jabs more.  ??? Your baby sleeps 20 to 45 minutes at a time and is more active at certain times of day.  ??? If your doctor wants you to count your baby's kicks:  ? Empty your bladder, and lie on your side or relax in a comfortable chair.  ? Write down your start time.  ? Pay attention only to your baby's movements. Count any movement except hiccups.  ? After you have counted 10 movements, write down your stop time.  ? Write down how many minutes it took for your baby to move 10 times.  ? If an hour goes by and you have not recorded 10 movements, have something to eat or drink and then count for another hour. If you do not record 10 movements in either hour, call your doctor.    Consider breastfeeding  ??? Experts recommend that women breastfeed for 1 year or longer.  ??? Breast milk may help protect your child from some health problems. Breastfed babies are less likely than formula-fed babies to:  ? Get ear infections, colds, diarrhea, and pneumonia.  ? Be obese or get diabetes later in life.  ??? Women   who breastfeed have less bleeding after the birth. Their uteruses also shrink back faster.  ??? Some women who breastfeed lose weight faster. Making milk burns calories.  ??? Breastfeeding can lower your risk of breast cancer, ovarian cancer, and osteoporosis.    Decide about circumcision for boys.  ?? As you make this decision, it may help to think about your personal, religious, and family traditions. You get to decide if you will keep your son's penis natural or if he will be circumcised.  ?? If you decide that you would like to have your baby circumcised, talk with your doctor. You can share your concerns about pain. And you can discuss your preferences for anesthesia.    Group B Strep  ?? Group B strep infection is caused by a type of bacteria. It's a different kind of bacteria than the kind that causes strep throat.   ?? At 36 weeks,  you can expect your doctor to collect a Group B Strep Vaginal swab. GBS is a common bacteria that can live in the vagina and rectum. This is not and STD.   ?? You may have this kind of bacteria in your body. Sometimes it may cause an infection, but most of the time it doesn't make you sick or cause symptoms. But if you pass the bacteria to your baby during the birth, it can cause serious health problems for your baby.   ?? If you have this bacteria, you will get antibiotics when you are in labor. Antibiotics help prevent problems for a newborn baby.  ?? After birth, doctors will watch and may test your baby. If your baby tests positive for Group B strep, he or she will get antibiotics.  ?? If you plan to breastfeed your baby, don't worry. It will be safe to breastfeed.  ?? Tell your doctor if you are allergic to any antibiotic.  ?? If your water breaks, go to the hospital right away. Tell the doctors and nurses at the hospital that you tested positive for group B strep. Your doctor will give you antibiotics to help protect your baby from infection.    What To Expect  ??? If you haven't already had the Tdap shot during this pregnancy, talk to your doctor about getting it. It will help protect your newborn against pertussis infection (Whooping Cough).  ??? If there are indications, your doctor may discuss getting repeat ultrasounds or NSTs (non-stress test) to monitor how baby is doing during this time. An NST is a test that checks your baby's heartbeat patterns. It can show heart rate changes when the baby moves. It also shows changes when you have contractions, if you're having them. A fetal heart rate that speeds up when the baby moves means the baby is getting enough oxygen.    Follow-up care is a key part of your treatment and safety. Be sure to make and go to all appointments, and call your doctor if you are having problems. It's also a good idea to know your test results and keep a list of the medicines you take.    When  to call your doctor     Call 911  anytime you think you may need emergency care. For example, call if:  ?? You have severe vaginal bleeding.  ?? You have sudden, severe pain in your belly.  ?? You passed out (lost consciousness).  ?? You have a seizure.  ?? You see or feel the umbilical cord.  ?? You think   you are about to deliver your baby and can't make it safely to the hospital.    Call your doctor now or seek immediate medical care if:  1. You have vaginal bleeding.  2. You have belly pain.  3. You have a fever.  4. You have symptoms of preeclampsia, such as:  ? Sudden swelling of your face, hands, or feet.  ? New vision problems (such as dimness, blurring, or seeing spots).  ? A severe headache.  5. You have a sudden release of fluid from your vagina. (You think your water broke.)  6. You think that you may be in labor. This means that you've had at least 6 contractions in an hour.  7. You notice that your baby has stopped moving or is moving much less than normal.  8. You have symptoms of a urinary tract infection. These may include:  ? Pain or burning when you urinate.  ? A frequent need to urinate without being able to pass much urine.  ? Pain in the flank, which is just below the rib cage and above the waist on either side of the back.  ? Blood in your urine.

## 2020-09-21 NOTE — Assessment & Plan Note (Signed)
Patient and partner did not desire translator during this visit

## 2020-09-21 NOTE — Assessment & Plan Note (Signed)
Had [place] 09/14/20: The placenta is anterior and has a normal sonographic echotexture. ??A placenta accreta is not suspected at this time.

## 2020-09-21 NOTE — Assessment & Plan Note (Signed)
No complaints. Discussed GBS negative. Denied LOF, DFM, VB, Ctx. Given labor precautions and directions to L&D triage. Discussed scheduling for repeat Cesarean section closer to due date at 41 weeks if she does not go into labor on her own, admitted understanding.

## 2020-09-21 NOTE — Progress Notes (Signed)
ROB @ [redacted]w[redacted]d - (+) FM, denies VB/CTX/LOF    Problem list and all histories reviewed.  Any changes are updated, see pregnancy episode and problem list.    PE:  Gen: NAD, A&Ox3  HEENT: NC/AT, EOMI  Abd: nontender, uterus palpable on exam  BLE: without edema, nontender    ASSESSMENT/PLAN:  Placenta accreta, antepartum  Had [place] 09/14/20: The placenta is anterior and has a normal sonographic echotexture. ??A placenta accreta is not suspected at this time.     Previous cesarean delivery affecting pregnancy  Still desires TOLAC, consent signed by Dr. Modesto Charon at previous visit.     Non-English speaking patient  Patient and partner did not desire translator during this visit     High-risk pregnancy supervision, unspecified trimester  No complaints. Discussed GBS negative. Denied LOF, DFM, VB, Ctx. Given labor precautions and directions to L&D triage. Discussed scheduling for repeat Cesarean section closer to due date at 41 weeks if she does not go into labor on her own, admitted understanding.       Return in about 1 week (around 09/28/2020) for Return OB Visit.

## 2020-09-21 NOTE — Assessment & Plan Note (Signed)
Still desires TOLAC, consent signed by Dr. Modesto Charon at previous visit.

## 2020-09-28 ENCOUNTER — Encounter: Attending: Student in an Organized Health Care Education/Training Program

## 2020-09-28 NOTE — Progress Notes (Signed)
Department of Obstetrics and Gynecology  Labor and Delivery Triage Note        CHIEF COMPLAINT: Ctxs, LOF, Cough    HISTORY OF PRESENT ILLNESS:      The patient is a 29 y.o.  [redacted]w[redacted]d who presents with intermittent contractions since yesterday and possible LOF today at 5 PM. Pt also reports a cough since yesterday, denies SOB or chest pain or any sick contacts. Has a hx of asthma, has not had increased use of albuterol at home since cough started. Has not been vaccinated against COVID-19.  OB History     Gravida   2    Para   1    Term   1    Preterm   0    AB   0    Living   1       SAB   0    IAB   0    Ectopic   0    Molar   0    Multiple   0    Live Births   1              Patient presents with a chief complaint as above.   Denie DFM/VB    Estimated Due Date:  Estimated Date of Delivery: 10/12/20    PAST MEDICAL HISTORY:   Past Medical History:   Diagnosis Date   ??? Mild intermittent asthma without complication 07/25/2020       PAST  SURGICAL HISTORY:   Past Surgical History:   Procedure Laterality Date   ??? CESAREAN SECTION  2016       SOCIAL HISTORY:     reports that she has never smoked. She has never used smokeless tobacco. She reports that she does not drink alcohol and does not use drugs.     MEDICATIONS:    Prior to Admission medications    Medication Sig Start Date End Date Taking? Authorizing Provider   famotidine (PEPCID) 20 MG tablet Take 1 tablet by mouth 2 times daily 09/13/20  Yes John Karvounides, DO   albuterol sulfate HFA (VENTOLIN HFA) 108 (90 Base) MCG/ACT inhaler Inhale 2 puffs into the lungs 4 times daily as needed for Wheezing 08/30/20  Yes Laban Emperor, DO   Prenatal Vit-Fe Fumarate-FA (PRENATAL VITAMIN) 27-1 MG TABS tablet Take 1 tablet by mouth daily 08/30/20 09/29/21 Yes John Karvounides, DO   pyridoxine (B-6) 25 MG tablet Take 1 tablet by mouth every 6 hours 03/16/20  Yes Sonny Masters Kusic, APRN - CNP   doxyLAMINE succinate (GNP SLEEP AID) 25 MG tablet Take 1 tablet by mouth  nightly  Patient not taking: Reported on 03/24/2020 03/16/20   Sonny Masters Kusic, APRN - CNP        PRENATAL CARE:    Complicated by: Previous c/s, asthma    REVIEW OF SYSTEMS:     Pertinent items are noted in HPI.    APPEARANCE:      Pain:  no      PHYSICAL EXAM:    Vital Signs: Tachycardia, otherwise VS WNL, respirations unlabored    Vitals:    09/28/20 2008   BP: 108/69   Pulse: 131   Resp: 18   Temp: 98.9 ??F (37.2 ??C)   TempSrc: Oral       Abdomen: soft, NT, ND, no rebound/guarding  Uterus:  gravid/non-tender  LE Edema: trace  Speculum Exam:  no pooling of fluid seen, Nitrizine test is negative, Ferning test is  negative, wet prep results: no pathogens  Fetal heart rate:  Category I  Cervix:  Closed  Contraction frequency:  none  Membranes:  Intact  Cardiac: Tachycardic  Resp: Mild expiratory wheezing throughout      TRIAGE COURSE:    Pt's cervix is closed and not contracting on the monitor, intermittently coughing. SSE is negative x 3 as is her wet prep. Will send COVID swab and treat with duoneb given her cough and hx of asthma. Will treat her tachycardia with IVF.    Electronically signed by Burnard Bunting, DO on 09/28/20 at 8:45 PM EST     Pt tachyardia has improved after fluids. She does not want to wait for a breathing treatment here, denies any SOB and has an albuterol inhaler at home that she will use. Will discharge home at this time. Respiratory panel pending.    Electronically signed by Burnard Bunting, DO on 09/28/20 at 9:57 PM EST       IMPRESSION:     Contractions, Cough, Vagnial discharge          DISCUSSED WITH [facility] PROVIDER:  Dr. Wynell Balloon, Dr, Ila Mcgill    DISPOSITION:  Discharge to Home

## 2020-09-28 NOTE — Other (Unsigned)
Patient Acct Nbr: [id number]   Primary AUTH/CERT:   Primary Insurance Company Name: [employer] Plan name: Luther Redo [facility]  Primary Insurance Group Number: [id number]  Primary Insurance Plan Type: Health  Primary Insurance Policy Number: [id number]

## 2020-09-28 NOTE — Discharge Instructions (Signed)
Follow up appointment with your doctor/midwife - Keep next scheduled appointment    Activity - Normal Activity    Call your doctor/midwife if you have:    - leaking fluid  - vaginal bleeding  - regular contractions:  Every 5 minutes or closer for one hour  - decreased fetal movement  - worsening abdominal (belly) pain  - headache, blurry vision, increased swelling, upper abdominal pain    If you are going home with contractions that are uncomfortable/painful- we recommend these coping strategies: rhythmic breathing, hydrotherapy, imagery or visualization, gentle massage, walking and changing your position.    Treatment Verification: Terri Summers was assessed on Labor and Delivery for a pregnancy related visit on 09/28/20.    Burnard Bunting, DO  [facility]

## 2020-09-29 LAB — RESPIRATORY PANEL, MOLECULAR, WITH COVID-19: Respiratory Panel Molecular, with COVID: POSITIVE — AB

## 2020-09-29 MED ORDER — LACTATED RINGERS IV BOLUS
Freq: Once | INTRAVENOUS | Status: AC
Start: 2020-09-29 — End: 2020-09-28
  Administered 2020-09-29: 02:00:00 1000 mL via INTRAVENOUS

## 2020-09-29 MED ORDER — IPRATROPIUM-ALBUTEROL 0.5-2.5 (3) MG/3ML IN SOLN
Freq: Once | RESPIRATORY_TRACT | Status: DC
Start: 2020-09-29 — End: 2020-09-28

## 2020-10-02 NOTE — Discharge Instructions (Signed)
Follow up appointment with your doctor/midwife - Keep next scheduled appointment    Activity - Normal Activity    Call your doctor/midwife if you have:    - leaking fluid  - vaginal bleeding  - regular contractions:  Every 5 minutes or closer for one hour  - decreased fetal movement  - worsening abdominal (belly) pain  - headache, blurry vision, increased swelling, upper abdominal pain    If you are going home with contractions that are uncomfortable/painful- we recommend these coping strategies: rhythmic breathing, hydrotherapy, imagery or visualization, gentle massage, walking and changing your position.    Treatment Verification: Jelitza Manninen was assessed on Labor and Delivery for a pregnancy related visit on 10/02/20.    Georgann Housekeeper, MD  [facility]

## 2020-10-02 NOTE — Progress Notes (Signed)
Department of Obstetrics and Gynecology  Labor and Delivery Triage Note        CHIEF COMPLAINT: SOB    HISTORY OF PRESENT ILLNESS:      The patient is a 29 y.o.  [redacted]w[redacted]d.    OB History     Gravida   2    Para   1    Term   1    Preterm   0    AB   0    Living   1       SAB   0    IAB   0    Ectopic   0    Molar   0    Multiple   0    Live Births   1            Pt presents with known influenza initially positive on 12/1. She also has known asthma with an albuterol inhaler at home. She has been using her albuterol inhaler at home with control of her asthma. Today she used her albuterol inhaler without improvement in her SOB/cough. Denies n/v.   Patient presents with a chief complaint as above.   Denies DFM/VB/LOF/CTX    Estimated Due Date:  Estimated Date of Delivery: 10/12/20    PAST MEDICAL HISTORY:   Past Medical History:   Diagnosis Date   ??? Mild intermittent asthma without complication 07/25/2020       PAST  SURGICAL HISTORY:   Past Surgical History:   Procedure Laterality Date   ??? CESAREAN SECTION  2016       SOCIAL HISTORY:     reports that she has never smoked. She has never used smokeless tobacco. She reports that she does not drink alcohol and does not use drugs.     MEDICATIONS:    Prior to Admission medications    Medication Sig Start Date End Date Taking? Authorizing Provider   famotidine (PEPCID) 20 MG tablet Take 1 tablet by mouth 2 times daily 09/13/20  Yes John Karvounides, DO   albuterol sulfate HFA (VENTOLIN HFA) 108 (90 Base) MCG/ACT inhaler Inhale 2 puffs into the lungs 4 times daily as needed for Wheezing 08/30/20  Yes Laban Emperor, DO   Prenatal Vit-Fe Fumarate-FA (PRENATAL VITAMIN) 27-1 MG TABS tablet Take 1 tablet by mouth daily 08/30/20 09/29/21 Yes John Karvounides, DO   pyridoxine (B-6) 25 MG tablet Take 1 tablet by mouth every 6 hours 03/16/20  Yes Sonny Masters Kusic, APRN - CNP   doxyLAMINE succinate (GNP SLEEP AID) 25 MG tablet Take 1 tablet by mouth nightly  Patient not taking: Reported on  03/24/2020 03/16/20   Sonny Masters Kusic, APRN - CNP        PRENATAL CARE:    Complicated by:   Hx CD  Hx fetal macrosomia  Asthma  Suspected accreta, resolved  Placenta previa, resolved    REVIEW OF SYSTEMS:     Pertinent items are noted in HPI.    APPEARANCE:      Pain:  no      PHYSICAL EXAM:    Vital Signs: BP normotensive, tachycardic and tachypnic, O2 sat wnl    Vitals:    10/02/20 1940   BP: 123/61   Pulse: 120   Resp: (!) 32   Temp: 97.7 ??F (36.5 ??C)   TempSrc: Oral   SpO2: 99%   Weight: 151 lb (68.5 kg)   Height: 5' (1.524 m)     Lungs: diffuse wheezing  throughout, rhonchi on the right lung base  Abdomen: soft, NT, ND, no rebound/guarding  Uterus:  gravid/non-tender  LE Edema: trace  Fetal heart rate:  Category I  Contraction frequency:  none    GENERAL LABS:      No results found for this or any previous visit (from the past 24 hour(s)).    TRIAGE COURSE:    Patient with tachypnea and audible wheezing. Will give Duoneb and reassess. O2 sat wnl. EKG obtained due to tachycardia.   Electronically signed by Georgann Housekeeper, MD on 10/02/20 at 8:17 PM EST    EKG sinus tachycardia. Respiratory at bedside performing duonebs now.   Electronically signed by Georgann Housekeeper, MD on 10/02/20 at 8:29 PM EST    Breathing significantly improved and wheezing resolved. Patient now having left rib pain, which is reproducible. Suspect musculoskeletal given coughing. Will give lidocaine patch. Also endorsing GERD, will give dose of pepcid.   Electronically signed by Georgann Housekeeper, MD on 10/02/20 at 9:14 PM EST    Dose of tylenol given for persistent left rib pain. Will discharge home with fluticasone inhaler.  Electronically signed by Georgann Housekeeper, MD on 10/02/20 at 10:21 PM EST      IMPRESSION:     URI          DISCUSSED WITH [facility] PROVIDER:  Dr. Wynell Balloon    DISPOSITION:  Discharge to Home

## 2020-10-02 NOTE — Other (Unsigned)
Patient Acct Nbr: [id number]   Primary AUTH/CERT:   Primary Insurance Company Name: [facility]  [employer] Plan name: Luther Redo [facility]  Primary Insurance Group Number: [id number]  Primary Insurance Plan Type: Health  Primary Insurance Policy Number: [id number]

## 2020-10-03 ENCOUNTER — Inpatient Hospital Stay: Payer: MEDICAID

## 2020-10-03 MED ORDER — FAMOTIDINE 20 MG PO TABS
20 | ORAL | Status: AC
Start: 2020-10-03 — End: 2020-10-02

## 2020-10-03 MED ORDER — BUDESONIDE-FORMOTEROL FUMARATE 160-4.5 MCG/ACT IN AERO
Freq: Two times a day (BID) | RESPIRATORY_TRACT | 1 refills | Status: AC
Start: 2020-10-03 — End: ?

## 2020-10-03 MED ORDER — LIDOCAINE 4 % EX PTCH
4 % | Freq: Every day | CUTANEOUS | Status: DC
Start: 2020-10-03 — End: 2020-10-02

## 2020-10-03 MED ORDER — LIDOCAINE 4 % EX PTCH
4 % | Freq: Every day | CUTANEOUS | Status: DC
Start: 2020-10-03 — End: 2020-10-03
  Administered 2020-10-03: 02:00:00 1 via TRANSDERMAL

## 2020-10-03 MED ORDER — FAMOTIDINE 20 MG PO TABS
20 MG | Freq: Two times a day (BID) | ORAL | Status: DC
Start: 2020-10-03 — End: 2020-10-03
  Administered 2020-10-03: 02:00:00 20 mg via ORAL

## 2020-10-03 MED ORDER — IPRATROPIUM-ALBUTEROL 0.5-2.5 (3) MG/3ML IN SOLN
Freq: Once | RESPIRATORY_TRACT | Status: AC
Start: 2020-10-03 — End: 2020-10-02
  Administered 2020-10-03: 01:00:00 1 via RESPIRATORY_TRACT

## 2020-10-03 MED ORDER — ACETAMINOPHEN 500 MG PO TABS
500 MG | Freq: Once | ORAL | Status: AC
Start: 2020-10-03 — End: 2020-10-02
  Administered 2020-10-03: 02:00:00 1000 mg via ORAL

## 2020-10-03 MED ORDER — IPRATROPIUM-ALBUTEROL 0.5-2.5 (3) MG/3ML IN SOLN
RESPIRATORY_TRACT | 2 refills | Status: DC | PRN
Start: 2020-10-03 — End: 2020-10-02

## 2020-10-03 MED FILL — FAMOTIDINE 20 MG PO TABS: 20 mg | ORAL | Qty: 1

## 2020-10-03 MED FILL — MAPAP 500 MG PO TABS: 500 mg | ORAL | Qty: 2

## 2020-10-03 MED FILL — ASPERCREME LIDOCAINE 4 % EX PTCH: 4 % | CUTANEOUS | Qty: 1

## 2020-10-11 ENCOUNTER — Inpatient Hospital Stay: Payer: MEDICAID

## 2020-10-11 NOTE — Other (Unsigned)
Patient Acct Nbr: [id number]   Primary AUTH/CERT:   Primary Insurance Company Name: [facility]  [employer] Plan name: Luther Redo [facility]  Primary Insurance Group Number: [id number]  Primary Insurance Plan Type: Health  Primary Insurance Policy Number: [id number]

## 2020-10-11 NOTE — Discharge Instructions (Signed)
Follow up appointment with your doctor/midwife - Keep next scheduled appointment    Activity - Normal Activity    Call your doctor/midwife if you have:    - leaking fluid  - vaginal bleeding  - regular contractions:  Every 5 minutes or closer for one hour  - decreased fetal movement  - worsening abdominal (belly) pain  - headache, blurry vision, increased swelling, upper abdominal pain    If you are going home with contractions that are uncomfortable/painful- we recommend these coping strategies: rhythmic breathing, hydrotherapy, imagery or visualization, gentle massage, walking and changing your position.    Treatment Verification: Javier Mamone was assessed on Labor and Delivery for a pregnancy related visit on 10/11/20.    Maryruth Eve, DO  [facility]

## 2020-10-11 NOTE — Progress Notes (Signed)
Department of Obstetrics and Gynecology  Labor and Delivery Triage Note        CHIEF COMPLAINT: VB    HISTORY OF PRESENT ILLNESS:      The patient is a 29 y.o.  [redacted]w[redacted]d.    OB History     Gravida   2    Para   1    Term   1    Preterm   0    AB   0    Living   1       SAB   0    IAB   0    Ectopic   0    Molar   0    Multiple   0    Live Births   1              Patient presents with a chief complaint as above. Bleeding started yesterday and has progressively worsened, unable to quantify bleeding at this time.  She is also feeling contraction like pain every 2-3 minutes, rates pain as an 8/10.  Denies any headache, blurry vision, chest pain, shortness of breath.    Denies DFM/LOF    Estimated Due Date:  Estimated Date of Delivery: 10/12/20    PAST MEDICAL HISTORY:   Past Medical History:   Diagnosis Date   ??? Mild intermittent asthma without complication 07/25/2020       PAST  SURGICAL HISTORY:   Past Surgical History:   Procedure Laterality Date   ??? CESAREAN SECTION  2016       SOCIAL HISTORY:     reports that she has never smoked. She has never used smokeless tobacco. She reports that she does not drink alcohol and does not use drugs.     MEDICATIONS:    Prior to Admission medications    Medication Sig Start Date End Date Taking? Authorizing Provider   budesonide-formoterol (SYMBICORT) 160-4.5 MCG/ACT AERO Inhale 2 puffs into the lungs 2 times daily 10/02/20  Yes Renae Runell Gess, MD   famotidine (PEPCID) 20 MG tablet Take 1 tablet by mouth 2 times daily 09/13/20  Yes John Karvounides, DO   albuterol sulfate HFA (VENTOLIN HFA) 108 (90 Base) MCG/ACT inhaler Inhale 2 puffs into the lungs 4 times daily as needed for Wheezing 08/30/20  Yes Laban Emperor, DO   Prenatal Vit-Fe Fumarate-FA (PRENATAL VITAMIN) 27-1 MG TABS tablet Take 1 tablet by mouth daily 08/30/20 09/29/21 Yes John Karvounides, DO   pyridoxine (B-6) 25 MG tablet Take 1 tablet by mouth every 6 hours 03/16/20  Yes Sonny Masters Kusic, APRN - CNP   doxyLAMINE  succinate (GNP SLEEP AID) 25 MG tablet Take 1 tablet by mouth nightly  Patient not taking: Reported on 03/24/2020 03/16/20   Sonny Masters Kusic, APRN - CNP        PRENATAL CARE:    Complicated by: Asthma, hx of cesarean section    REVIEW OF SYSTEMS:     Pertinent items are noted in HPI.    APPEARANCE:      Pain:  no      PHYSICAL EXAM:    Vital Signs: VS wnl-reviewed/Respirations normal effort    Vitals:    10/11/20 0726   BP: 117/64   Pulse: 84   Resp: 16   Temp: 98.4 ??F (36.9 ??C)       Abdomen: soft, NT, ND, no rebound/guarding  Uterus:  gravid/non-tender  LE Edema: trace  Speculum Exam:  blood not present  Fetal heart rate:  Category I  Cervix:  0-1/50/-4  Contraction frequency:  irregular, every 10 minutes  Membranes:  Intact      RESULTS:      GENERAL LABS:      No results found for this or any previous visit (from the past 24 hour(s)).    TRIAGE COURSE:  SSE no blood present in the vaginal vault or from the cervix, dark brown mucous discharge present.  Patient appears comfortable.  Vertex on BSUS.  Offered a risk reducing IOL given GA, patient and partner declined, prefer to go home and see if contractions strengthen and become more frequent.  Counseled extensively on return precautions.  Patient has an appointment in the office tomorrow, recommended discussing with provider scheduling an induction.    Electronically signed by Maryruth Eve, DO on 10/11/20 at 9:19 AM EST      IMPRESSION:     False Labor          DISCUSSED WITH [facility] PROVIDER:  Dr. Juventino Slovak    DISPOSITION:  Discharge to Home

## 2020-10-12 ENCOUNTER — Inpatient Hospital Stay
Admit: 2020-10-12 | Discharge: 2020-10-16 | Disposition: A | Discharge status: Home / Self Care | Payer: MEDICAID | Attending: Obstetrics & Gynecology | Admitting: Obstetrics & Gynecology

## 2020-10-12 ENCOUNTER — Encounter

## 2020-10-12 LAB — CBC
Hematocrit: 36.7 % (ref 35.0–47.0)
Hemoglobin: 11.9 g/dL (ref 11.7–16.0)
MCH: 28 pg (ref 26.0–34.0)
MCHC: 32.4 % (ref 32.0–36.0)
MCV: 86.3 fL (ref 79.0–98.0)
MPV: 8.8 fL (ref 7.4–10.4)
Platelets: 343 10*3/uL (ref 140–440)
RBC: 4.26 10*6/uL (ref 3.80–5.20)
RDW: 16.2 % — ABNORMAL HIGH (ref 11.5–14.5)
WBC: 12.5 10*3/uL — ABNORMAL HIGH (ref 3.6–10.7)

## 2020-10-12 LAB — TYPE AND SCREEN
Antibody Screen: NEGATIVE NA
Rh Type: POSITIVE NA

## 2020-10-12 MED ORDER — IBUPROFEN 600 MG PO TABS
600 MG | Freq: Once | ORAL | Status: DC
Start: 2020-10-12 — End: 2020-10-13

## 2020-10-12 MED ORDER — ACETAMINOPHEN 325 MG PO TABS
325 MG | ORAL | Status: DC | PRN
Start: 2020-10-12 — End: 2020-10-13

## 2020-10-12 MED ORDER — ROPIVACAINE HCL 2 MG/ML IJ SOLN
INTRAMUSCULAR | Status: AC
Start: 2020-10-12 — End: 2020-10-12

## 2020-10-12 MED ORDER — NORMAL SALINE FLUSH 0.9 % IV SOLN
0.9 % | INTRAVENOUS | Status: DC | PRN
Start: 2020-10-12 — End: 2020-10-13

## 2020-10-12 MED ORDER — PHYTONADIONE 1 MG/0.5ML IJ SOLN
1 MG/0.5ML | INTRAMUSCULAR | Status: DC
Start: 2020-10-12 — End: 2020-10-13

## 2020-10-12 MED ORDER — ONDANSETRON HCL 4 MG/2ML IJ SOLN
4 MG/2ML | Freq: Four times a day (QID) | INTRAMUSCULAR | Status: DC | PRN
Start: 2020-10-12 — End: 2020-10-13

## 2020-10-12 MED ORDER — BENZOCAINE-BENZETHONIUM 20-0.2 % EX AERO
CUTANEOUS | Status: DC | PRN
Start: 2020-10-12 — End: 2020-10-13

## 2020-10-12 MED ORDER — OXYTOCIN-SODIUM CHLORIDE 30-0.9 UT/500ML-% IV SOLN
INTRAVENOUS | Status: DC
Start: 2020-10-12 — End: 2020-10-13
  Administered 2020-10-12: 21:00:00 1 m[IU]/min via INTRAVENOUS

## 2020-10-12 MED ORDER — LACTATED RINGERS IV BOLUS
INTRAVENOUS | Status: DC | PRN
Start: 2020-10-12 — End: 2020-10-13
  Administered 2020-10-12: 19:00:00 1000 mL via INTRAVENOUS

## 2020-10-12 MED ORDER — ROPIVACAINE 0.2% IN SODIUM CHLORIDE 0.9% (OB) EPIDURAL SYRINGE
Status: DC
Start: 2020-10-12 — End: 2020-10-13

## 2020-10-12 MED ORDER — ROPIVACAINE 0.2% IN SODIUM CHLORIDE 0.9% (OB) EPIDURAL SYRINGE
Status: AC
Start: 2020-10-12 — End: 2020-10-12

## 2020-10-12 MED ORDER — LACTATED RINGERS IV BOLUS
INTRAVENOUS | Status: DC | PRN
Start: 2020-10-12 — End: 2020-10-13
  Administered 2020-10-12: 21:00:00 500 mL via INTRAVENOUS

## 2020-10-12 MED ORDER — ROPIVACAINE 0.2% IN SODIUM CHLORIDE 0.9% (OB) EPIDURAL
Status: DC
Start: 2020-10-12 — End: 2020-10-13
  Administered 2020-10-13: 10:00:00 8 mL/h via EPIDURAL

## 2020-10-12 MED ORDER — NORMAL SALINE FLUSH 0.9 % IV SOLN
0.9 | Freq: Two times a day (BID) | INTRAVENOUS | Status: DC
Start: 2020-10-12 — End: 2020-10-13

## 2020-10-12 MED ORDER — SODIUM CHLORIDE 0.9 % IV SOLN
0.9 % | INTRAVENOUS | Status: DC | PRN
Start: 2020-10-12 — End: 2020-10-13

## 2020-10-12 MED ORDER — LACTATED RINGERS IV SOLN
INTRAVENOUS | Status: DC
Start: 2020-10-12 — End: 2020-10-13
  Administered 2020-10-13: 05:00:00 via INTRAVENOUS

## 2020-10-12 MED FILL — NAROPIN 2 MG/ML IJ SOLN: 2 mg/mL | INTRAMUSCULAR | Qty: 20

## 2020-10-12 MED FILL — ROPIVACAINE HCL 2 MG/ML IJ SOLN: INTRAMUSCULAR | Qty: 100

## 2020-10-12 MED FILL — OXYTOCIN-SODIUM CHLORIDE 30-0.9 UT/500ML-% IV SOLN: 30-0.9 UT/500ML-% | INTRAVENOUS | Qty: 500

## 2020-10-12 MED FILL — PHYTONADIONE 1 MG/0.5ML IJ SOLN: 1 MG/0.5ML | INTRAMUSCULAR | Qty: 0.5

## 2020-10-12 MED FILL — ROPIVACAINE 0.2% IN SODIUM CHLORIDE 0.9% (OB) EPIDURAL SYRINGE: Qty: 20

## 2020-10-12 NOTE — Progress Notes (Signed)
Labor Progress Note  Date: 10/12/2020         Time: 2:38 PM    Subjective:  Terri Summers is a 29 y.o. female at [redacted]w[redacted]d    1. TOLAC 55.7% MFMU calculator  2. Asthma - Mild, intermittent     SVE on admission 1cm/50/-3. SROM @ 0600 12/15.    Cx: Defer     FHP: Defer    FHT: Cat I     Toco: q5-166mutes  A/P: 1. AOL-PROM: Cat I with moderate variability and spontaneous accelerations. No Augmentation at this time. Patient requesting epidural. Will recheck and assess if need pitocin augmentation after epidural. BP normotensive. CCM.   Electronically signed by MEBenay PikeDO on 10/12/20 at 2:38 PM EST      Patient checked at 1430 by Dr HiBerdine Addisonunchanged.      Cx: defer     FHP: defer     FHT: Cat I    Toco: q5 minutes   A/P: 1.  AOL-PROM: Cat I with moderate variability and spontaneous accelerations. Pit @ 2cc/hr. BP Normotensive. CCM.   Electronically signed by MEBenay PikeDO on 10/12/20 at 4:33 PM EST      CxZO:XWRUE   FHAVW:UJWJX  FHT: Cat I    Toco:q3-79m13m A/P: 1. AOL-PROM  Pit at 10 cc/h, continue to titrate per standard protocol. CCM  Electronically signed by HanDarden AmberO on 10/12/20 at 6:46 PM EST      Cx:BJ:YNWGN  FHPFAO:ZHYQM FHT: Cat I    Toco:q2-3mi75mA/P: 1. AOL-PROM  Pit at 12 cc/h, continue to titrate per standard protocol. Will consider placement of IUPC if unable to further titrate pitocin given frequency of contractions. CCM  Electronically signed by HannDarden Amber on 10/12/20 at 9:16 PM EST      Cx:5/70/0     FHT: Cat I    Toco:q1-79min50m/P: 1. AOL-PROM  Cat I with mod variability and accels. BP normotensive. Pit at 15 cc/hr, will continue to titrate per protocol. Patient very uncomfortable and feeling the urge to push. Pushed epidural button. CCM  Electronically signed by RenaeJose Persiaon 10/12/20 at 11:47 PM EST      Cervix unchanged. Foley placed at this time. IUPC placed at this time. Cat I FHT.   Electronically signed by RenaeJose Persiaon 10/13/20 at 1:29 AM  EST      Cervix unchanged. Anterior cervix swollen. Will give benadryl and ice in the vagina. Cat II for rare late and variable decel. Overall reassuring with mod variability and accels. BP normotensive. Pit at 15 cc/hr, will continue to titrate per protocol. MVU inadequate. CCM  Electronically signed by RenaeJose Persiaon 10/13/20 at 3:27 AM EST      Cx:unVH:QIONGEXBMFHP:dWUX:LKGMWHT: Cat II    Toco:q2min 68mP: 1. AOL-PROM  Cat II for recurrent late decels for the last 15 minutes. Remains reassuring with moderate variability  SVE performed and unchanged. Some swelling noted anteriorly. Head feels asynclitic.   Suggested positioning with peanut ball to RN.   Pit at 15 cc/h, continue to titrate as able. Contractions q2min a70mough not tachysystole over the last 30 minutes. MVUs most recently adequate at 210.   Will consider discontinuing pitocin if remains Cat II. RN trying conservative interventions with position changes currently.  Electronically signed by Hannah Darden Amber 10/13/20  at 5:18 AM EST    Now 30 minutes of recurrent late decels despite fluid bolus and position changes. Will turn off pitocin at this time. CCM  Electronically signed by Jose Persia, MD on 10/13/20 at 5:39 AM EST      UY:QIHKV    FHT: Cat II    Toco:q2-51mn  A/P: 1. AOL-PROM  Cat II for period of intermittent late decels after last note time. Currently Cat I with mod variability and accels. BP normotensive to mild range. Pitocin remains off at this time. MVU inadequate. CCM    Electronically signed by RJose Persia MD on 10/13/20 at 6:56 AM EST    Patient became acutely uncomfortable with attempts to control her pain unsuccessful. Patient acutely worsening in pain worst along previous incision. Patient yells in pain with attempts to reposition her. Informed patient of concern for possible progressing uterine rupture and the recommendation for immediate cesarean section. Patient and partner understood and had questions  answered. I personally consented patient via interpreter device and explained the risks of bleeding (she is agreeable to blood transfusion if necessary), risk of infection, and damage to surrounding structures. Patient had time to have questions answered. At this time FHT reassuring with moderate variability and spont accels. Safety huddle called CCamera operator Primary RN, anesthesia all in agreement. This was an emergent (346m) cesarean section for suspected uterine rupture decision time 0817. Patient also met criteria for PreEwSF based on elevated LFTs twice the upper limit of normal. Magnesium sulfate ordered for seizure ppx. BP mild range to normotensive.     Electronically signed by SeCindy HazyDO on 10/13/20 at 8:33 AM EST

## 2020-10-12 NOTE — Progress Notes (Signed)
MFM     The patient and her husband came to [facility] evaluation reports LOF, some vaginal bleeding and she was very uncomfortable with contractions.  No ultrasound was performed and recommended immediate evaluation on labor and delivery.  Felt as though the husband driving her to the hospital would be faster than a wheelchair ride from [place] [facility] at [address].  Notified the resident staff of planned triage evaluation for labor.      Sanda Klein, MD

## 2020-10-12 NOTE — Anesthesia Pre-Procedure Evaluation (Signed)
OB Anesthesia History/Physical & Assessment Plan  Name: Terri Summers MRN: [id number] DOB: 1991-08-23    (Age-29 y.o.)       NPO Status:   Pregnancy -full stomach precautions    Mental Status: A&O x3    Height: 5' (152.4 cm)  Weight: 145 lb (65.8 kg)  BMI (Calculated): 28.4 Obstetric Preassesment     Surgeon:  [facility]      OB History   Gravida Para Term Preterm AB Living   2 1 1  0 0 1   SAB IAB Ectopic Molar Multiple Live Births   0 0 0 0 0 1        CURRENT MEDS W/ ASSOC DIAG     Med List Status: Complete Set By: , RN at 10/12/2020  1:45 PM            Start Date End Date     albuterol sulfate HFA (VENTOLIN HFA) 108 (90 Base) MCG/ACT inhaler  08/30/20  --     Inhale 2 puffs into the lungs 4 times daily as needed for Wheezing     Associated Diagnoses:  Mild intermittent asthma without complication     budesonide-formoterol (SYMBICORT) 160-4.5 MCG/ACT AERO  10/02/20  --     Inhale 2 puffs into the lungs 2 times daily     Associated Diagnoses:  --     famotidine (PEPCID) 20 MG tablet  09/13/20  --     Take 1 tablet by mouth 2 times daily     Associated Diagnoses:  Gastroesophageal reflux disease without esophagitis     Prenatal Vit-Fe Fumarate-FA (PRENATAL VITAMIN) 27-1 MG TABS tablet  08/30/20  09/29/21     Take 1 tablet by mouth daily     Associated Diagnoses:  Prenatal care, subsequent pregnancy in first trimester     pyridoxine (B-6) 25 MG tablet  03/16/20  --     Take 1 tablet by mouth every 6 hours     Associated Diagnoses:  Prenatal care, subsequent pregnancy in first trimester                 Patient not taking:      Associated Diagnoses:         Daily Beta Blocker: No No Known Allergies    Past Medical History:   Diagnosis Date   ??? Mild intermittent asthma without complication 07/25/2020        Past Surgical History:   Procedure Laterality Date   ??? CESAREAN SECTION  2016       Patient Active Problem List    Diagnosis Date Noted   ??? Vaginal discharge during pregnancy in third  trimester    ??? Cough    ??? Mild intermittent asthma without complication 07/25/2020   ??? Placenta previa antepartum 05/23/2020   ??? Placenta accreta, antepartum 05/23/2020   ??? High-risk pregnancy supervision, unspecified trimester 05/23/2020   ??? Non-English speaking patient 03/16/2020   ??? Previous cesarean delivery affecting pregnancy 03/16/2020   ??? History of macrosomia in infant in prior pregnancy, currently pregnant 03/16/2020        Lab Results   Component Value Date/Time    HGB 11.9 10/12/2020 01:06 PM    HCT 36.7 10/12/2020 01:06 PM    PLT 343 10/12/2020 01:06 PM    WBC 12.5 (H) 10/12/2020 01:06 PM   No results for input(s): GLUCOSE in the last 72 hours.No results found for this or any previous visit. Social History  Tobacco Use   Smoking Status Never Smoker   Smokeless Tobacco Never Used     Did you smoke today?nonsmoker Social History     Substance and Sexual Activity   Alcohol Use Never     Social History     Substance and Sexual Activity   Drug Use Never          OB History   Gravida Para Term Preterm AB Living   2 1 1  0 0 1   SAB IAB Ectopic Molar Multiple Live Births   0 0 0 0 0 1      # Outcome Date GA Lbr Len/2nd Weight Sex Delivery Anes PTL Lv   2 Current            1 Term 04/23/15 [redacted]w[redacted]d  9 lb (4.082 kg) F CS-LTranv EPI N LIV      Birth Comments: 2016      Complications: Fetal Intolerance, Failure to Progress in Second Stage     Any complications with this pregnancy?:  No problems with this prenancy  Any complications with prior pregnancies?:  Placenta previa/abruption - Previa and accreta with prior pregnancy = c-section            Any problems with anesthesia?:  No prior General Anesthestics  Any problems with neuraxial analgesia?:  Past Neuraxial Anesthetic without complications  Family History of problems with anesthesia?: None Risk Factors PONV:    Female: Yes (+1)  Motion sick or prior PONV: no  NonSmoker: Nonsmoker - Yes (+1)  Opiods for Procedure: Yes (+1)      Airway and Dentition Anesthestic  Plan   Mallampati: 3  Jaw Confirmation: WNL  Mouth Size: WNL  Neck: WNL  Dentures: None  Partials: None  Overall Dentition: Teeth intact with none loose          Anesthestic Plan: Epidural  Airway Management: No specific difficulties anticipated  PreOp Meds Given:   ,            Patient consented for general anesthetic in the event of an emergency   Patient consented for blood transfusion if clinically necessary      After discussion of risks, benefits, options, complications and personnel involved with care, patient's questions were answered to their satisfaction and consent to proceed with the anesthetic plan was given by the patient.      After review of the medical history and physical assessment, medications, allergies, patient's current medical condition, and labs, this patient is at acceptable risk for the planned anesthetic at this facility.      Electronically signed by: 2017, APRN - CRNA     Date: 10/12/2020 at 2:44 PM
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
## 2020-10-13 LAB — COMPREHENSIVE METABOLIC PANEL
ALT: 70 U/L — ABNORMAL HIGH (ref 0–34)
AST: 64 U/L — ABNORMAL HIGH (ref 15–46)
Albumin,Serum: 2.9 g/dL — ABNORMAL LOW (ref 3.5–5.0)
Alkaline Phosphatase: 176 U/L — ABNORMAL HIGH (ref 38–126)
Anion Gap: 8 mmol/L (ref 3–13)
BUN: 8 mg/dL — ABNORMAL LOW (ref 9–20)
CO2: 19 mmol/L — ABNORMAL LOW (ref 22–30)
Calcium: 8.7 mg/dL (ref 8.4–10.4)
Chloride: 105 mmol/L (ref 98–107)
Creatinine: 0.71 mg/dL (ref 0.52–1.25)
EGFR IF NonAfrican American: >90 mL/min (ref >60)
Glucose: 92 mg/dL (ref 70–100)
Potassium: 4.3 mmol/L (ref 3.5–5.1)
Sodium: 133 mmol/L — ABNORMAL LOW (ref 135–145)
Total Bilirubin: 1.1 mg/dL (ref 0.2–1.3)
Total Protein: 6 g/dL — ABNORMAL LOW (ref 6.3–8.2)
eGFR African American: >90 mL/min (ref >60)

## 2020-10-13 MED ORDER — ROPIVACAINE 0.2% IN SODIUM CHLORIDE 0.9% (OB) EPIDURAL SYRINGE
Status: AC
Start: 2020-10-13 — End: 2020-10-12

## 2020-10-13 MED ORDER — ROCURONIUM BROMIDE 50 MG/5ML IV SOLN
50 | INTRAVENOUS | Status: AC
Start: 2020-10-13 — End: 2020-10-13

## 2020-10-13 MED ORDER — NORMAL SALINE FLUSH 0.9 % IV SOLN
0.9 % | Freq: Two times a day (BID) | INTRAVENOUS | Status: DC
Start: 2020-10-13 — End: 2020-10-16
  Administered 2020-10-14: 14:00:00 10 mL via INTRAVENOUS

## 2020-10-13 MED ORDER — LACTATED RINGERS IV SOLN
INTRAVENOUS | Status: DC
Start: 2020-10-13 — End: 2020-10-13
  Administered 2020-10-13: 15:00:00 via INTRAVENOUS

## 2020-10-13 MED ORDER — PROPOFOL 200 MG/20ML IV EMUL
200 | INTRAVENOUS | Status: AC
Start: 2020-10-13 — End: 2020-10-13

## 2020-10-13 MED ORDER — ALBUTEROL SULFATE HFA 108 (90 BASE) MCG/ACT IN AERS
108 (90 Base) MCG/ACT | Freq: Four times a day (QID) | RESPIRATORY_TRACT | Status: DC | PRN
Start: 2020-10-13 — End: 2020-10-16

## 2020-10-13 MED ORDER — EPHEDRINE SULFATE (PRESSORS) 50 MG/ML IV SOLN
50 | INTRAVENOUS | Status: AC
Start: 2020-10-13 — End: 2020-10-13

## 2020-10-13 MED ORDER — CEFAZOLIN SODIUM-DEXTROSE 2-4 GM/100ML-% IV SOLN
2-4100- GM/100ML-% | INTRAVENOUS | Status: AC
Start: 2020-10-13 — End: 2020-10-13
  Administered 2020-10-13: 14:00:00 2

## 2020-10-13 MED ORDER — HYDROMORPHONE HCL 1 MG/ML IJ SOLN
1 MG/ML | INTRAMUSCULAR | Status: AC | PRN
Start: 2020-10-13 — End: 2020-10-14

## 2020-10-13 MED ORDER — PRENATAL 27-1 MG PO TABS
27-1 MG | Freq: Every day | ORAL | Status: DC
Start: 2020-10-13 — End: 2020-10-16

## 2020-10-13 MED ORDER — TETANUS-DIPHTH-ACELL PERTUSSIS 5-2.5-18.5 LF-MCG/0.5 IM SUSY
INTRAMUSCULAR | Status: DC
Start: 2020-10-13 — End: 2020-10-16

## 2020-10-13 MED ORDER — ONDANSETRON HCL 4 MG/2ML IJ SOLN
4 MG/2ML | Freq: Four times a day (QID) | INTRAMUSCULAR | Status: DC | PRN
Start: 2020-10-13 — End: 2020-10-16

## 2020-10-13 MED ORDER — DOCUSATE SODIUM 100 MG PO CAPS
100 MG | Freq: Two times a day (BID) | ORAL | Status: DC | PRN
Start: 2020-10-13 — End: 2020-10-16
  Administered 2020-10-14 – 2020-10-16 (×5): 100 mg via ORAL

## 2020-10-13 MED ORDER — PHENYLEPHRINE HCL (PRESSORS) 1 MG/10ML IV SOSY
1 | INTRAVENOUS | Status: AC
Start: 2020-10-13 — End: 2020-10-13

## 2020-10-13 MED ORDER — CEFAZOLIN SODIUM-DEXTROSE 2-4 GM/100ML-% IV SOLN
2-4100- GM/100ML-% | Freq: Once | INTRAVENOUS | Status: DC
Start: 2020-10-13 — End: 2020-10-13

## 2020-10-13 MED ORDER — ACETAMINOPHEN 325 MG PO TABS
325 MG | Freq: Four times a day (QID) | ORAL | Status: DC | PRN
Start: 2020-10-13 — End: 2020-10-16
  Administered 2020-10-14 – 2020-10-16 (×11): 650 mg via ORAL

## 2020-10-13 MED ORDER — OXYTOCIN-SODIUM CHLORIDE 30-0.9 UT/500ML-% IV SOLN
30-0.9 | INTRAVENOUS | Status: DC | PRN
Start: 2020-10-13 — End: 2020-10-13

## 2020-10-13 MED ORDER — FENTANYL CITRATE (PF) 100 MCG/2ML IJ SOLN
100 | INTRAMUSCULAR | Status: AC
Start: 2020-10-13 — End: 2020-10-13

## 2020-10-13 MED ORDER — NORMAL SALINE FLUSH 0.9 % IV SOLN
0.9 | INTRAVENOUS | Status: DC | PRN
Start: 2020-10-13 — End: 2020-10-13

## 2020-10-13 MED ORDER — ACETAMINOPHEN 10 MG/ML IV SOLN
10 | INTRAVENOUS | Status: AC
Start: 2020-10-13 — End: 2020-10-13

## 2020-10-13 MED ORDER — ROPIVACAINE HCL 5 MG/ML IJ SOLN
INTRAMUSCULAR | Status: AC
Start: 2020-10-13 — End: 2020-10-13

## 2020-10-13 MED ORDER — IBUPROFEN 600 MG PO TABS
600 MG | Freq: Four times a day (QID) | ORAL | Status: DC
Start: 2020-10-13 — End: 2020-10-16
  Administered 2020-10-14 – 2020-10-16 (×9): 600 mg via ORAL

## 2020-10-13 MED ORDER — LACTATED RINGERS IV BOLUS
Freq: Once | INTRAVENOUS | Status: AC
Start: 2020-10-13 — End: 2020-10-13
  Administered 2020-10-13: 13:00:00 1000 mL via INTRAVENOUS

## 2020-10-13 MED ORDER — MEASLES, MUMPS & RUBELLA VAC IJ SOLR
INTRAMUSCULAR | Status: DC
Start: 2020-10-13 — End: 2020-10-16

## 2020-10-13 MED ORDER — MOMETASONE FURO-FORMOTEROL FUM 200-5 MCG/ACT IN AERO
200-5 MCG/ACT | Freq: Two times a day (BID) | RESPIRATORY_TRACT | Status: DC
Start: 2020-10-13 — End: 2020-10-16
  Administered 2020-10-14 – 2020-10-16 (×6): 2 via RESPIRATORY_TRACT

## 2020-10-13 MED ORDER — DIPHENHYDRAMINE HCL 50 MG/ML IJ SOLN
50 MG/ML | Freq: Once | INTRAMUSCULAR | Status: AC
Start: 2020-10-13 — End: 2020-10-13
  Administered 2020-10-13: 09:00:00 25 mg via INTRAVENOUS

## 2020-10-13 MED ORDER — SOD CITRATE-CITRIC ACID 500-334 MG/5ML PO SOLN
500-334 MG/5ML | Freq: Once | ORAL | Status: AC
Start: 2020-10-13 — End: 2020-10-13
  Administered 2020-10-13: 13:00:00 30 mL via ORAL

## 2020-10-13 MED ORDER — MAGNESIUM SULFATE 20000 MG/500 ML INFUSION
20500 GM/500ML | INTRAVENOUS | Status: DC
Start: 2020-10-13 — End: 2020-10-14
  Administered 2020-10-14: 04:00:00 2000 mg/h via INTRAVENOUS

## 2020-10-13 MED ORDER — LANSINOH LANOLIN EX CREA
CUTANEOUS | Status: DC | PRN
Start: 2020-10-13 — End: 2020-10-16

## 2020-10-13 MED ORDER — DEXAMETH SOD PHOS-BUPIV-EPIN 0.01-0.375 %-1:200000 IJ SOSY
0.01-0.375 | INTRAMUSCULAR | Status: AC
Start: 2020-10-13 — End: 2020-10-13

## 2020-10-13 MED ORDER — SUGAMMADEX SODIUM 200 MG/2ML IV SOLN
200 | INTRAVENOUS | Status: AC
Start: 2020-10-13 — End: 2020-10-13

## 2020-10-13 MED ORDER — OXYCODONE HCL 5 MG PO TABS
5 MG | ORAL | Status: DC | PRN
Start: 2020-10-13 — End: 2020-10-16
  Administered 2020-10-14 – 2020-10-16 (×10): 5 mg via ORAL

## 2020-10-13 MED ORDER — SODIUM BICARBONATE 4.2 % IV SOLN
4.2 | INTRAVENOUS | Status: AC
Start: 2020-10-13 — End: 2020-10-13

## 2020-10-13 MED ORDER — MAGNESIUM SULFATE 4000 MG/100 ML IVPB PREMIX
4 GM/100ML | Freq: Once | INTRAVENOUS | Status: AC
Start: 2020-10-13 — End: 2020-10-13
  Administered 2020-10-13: 14:00:00 4000 mg via INTRAVENOUS

## 2020-10-13 MED ORDER — INDIGOTINDISULFONATE SODIUM 8 MG/ML IJ SOLN
8 | INTRAMUSCULAR | Status: AC
Start: 2020-10-13 — End: 2020-10-13

## 2020-10-13 MED ORDER — OXYTOCIN 0.06 UNIT/ML BOLUS FROM THE BAG (POST-PARTUM)
30 UNIT/500ML | INTRAVENOUS | Status: DC | PRN
Start: 2020-10-13 — End: 2020-10-13

## 2020-10-13 MED ORDER — CEFAZOLIN SODIUM 1 G IJ SOLR
1 | INTRAMUSCULAR | Status: AC
Start: 2020-10-13 — End: 2020-10-13

## 2020-10-13 MED ORDER — LIDOCAINE-EPINEPHRINE 2 %-1:200000 IJ SOLN
2 | INTRAMUSCULAR | Status: AC
Start: 2020-10-13 — End: 2020-10-13

## 2020-10-13 MED ORDER — SODIUM CHLORIDE 0.9 % IV SOLN
0.9 % | INTRAVENOUS | Status: DC | PRN
Start: 2020-10-13 — End: 2020-10-16

## 2020-10-13 MED ORDER — AZITHROMYCIN 500 MG IV SOLR
500 MG | INTRAVENOUS | Status: AC
Start: 2020-10-13 — End: 2020-10-13
  Administered 2020-10-13: 14:00:00

## 2020-10-13 MED ORDER — OXYTOCIN 0.06 UNIT/ML BOLUS FROM THE BAG (POST-PARTUM)
30500 UNIT/500ML | INTRAVENOUS | Status: DC
Start: 2020-10-13 — End: 2020-10-13
  Administered 2020-10-13: 14:00:00 500 [IU] via INTRAVENOUS

## 2020-10-13 MED ORDER — KETOROLAC TROMETHAMINE 30 MG/ML IJ SOLN
30 MG/ML | Freq: Four times a day (QID) | INTRAMUSCULAR | Status: AC
Start: 2020-10-13 — End: 2020-10-14
  Administered 2020-10-13 – 2020-10-14 (×3): 30 mg via INTRAVENOUS

## 2020-10-13 MED ORDER — ONDANSETRON HCL 4 MG/2ML IJ SOLN
4 | INTRAMUSCULAR | Status: AC
Start: 2020-10-13 — End: 2020-10-13

## 2020-10-13 MED ORDER — OXYTOCIN-SODIUM CHLORIDE 30-0.9 UT/500ML-% IV SOLN
30-0.9 | INTRAVENOUS | Status: DC | PRN
Start: 2020-10-13 — End: 2020-10-13
  Administered 2020-10-13: 15:00:00 125 m[IU]/min via INTRAVENOUS

## 2020-10-13 MED ORDER — NORMAL SALINE FLUSH 0.9 % IV SOLN
0.9 | Freq: Two times a day (BID) | INTRAVENOUS | Status: DC
Start: 2020-10-13 — End: 2020-10-13

## 2020-10-13 MED ORDER — DIPHENHYDRAMINE HCL 50 MG/ML IJ SOLN
50 MG/ML | Freq: Four times a day (QID) | INTRAMUSCULAR | Status: DC | PRN
Start: 2020-10-13 — End: 2020-10-16

## 2020-10-13 MED ORDER — ROPIVACAINE 0.2% IN SODIUM CHLORIDE 0.9% (OB) EPIDURAL SYRINGE
Status: AC
Start: 2020-10-13 — End: 2020-10-13

## 2020-10-13 MED ORDER — FERROUS SULFATE 325 (65 FE) MG PO TABS
325 (65 Fe) MG | Freq: Two times a day (BID) | ORAL | Status: DC
Start: 2020-10-13 — End: 2020-10-16
  Administered 2020-10-14 – 2020-10-16 (×5): 325 mg via ORAL

## 2020-10-13 MED ORDER — DEXAMETHASONE SOD PHOSPHATE PF 10 MG/ML IJ SOLN
10 | INTRAMUSCULAR | Status: AC
Start: 2020-10-13 — End: 2020-10-13

## 2020-10-13 MED ORDER — SODIUM CHLORIDE 0.9 % IV SOLN
0.9 | INTRAVENOUS | Status: DC | PRN
Start: 2020-10-13 — End: 2020-10-13

## 2020-10-13 MED ORDER — TRANEXAMIC ACID 1000 MG/10ML IV SOLN
1000 | INTRAVENOUS | Status: AC
Start: 2020-10-13 — End: 2020-10-13

## 2020-10-13 MED ORDER — SOD CITRATE-CITRIC ACID 500-334 MG/5ML PO SOLN
500-334 MG/5ML | ORAL | Status: DC
Start: 2020-10-13 — End: 2020-10-13

## 2020-10-13 MED ORDER — MIDAZOLAM HCL 2 MG/2ML IJ SOLN
2 | INTRAMUSCULAR | Status: AC
Start: 2020-10-13 — End: 2020-10-13

## 2020-10-13 MED ORDER — KETOROLAC TROMETHAMINE 60 MG/2ML IM SOLN
602 MG/2ML | Freq: Once | INTRAMUSCULAR | Status: AC
Start: 2020-10-13 — End: 2020-10-13
  Administered 2020-10-13: 17:00:00 60 mg via INTRAMUSCULAR

## 2020-10-13 MED ORDER — MAGNESIUM SULFATE 20000 MG/500 ML INFUSION
20 GM/500ML | INTRAVENOUS | Status: DC
Start: 2020-10-13 — End: 2020-10-13
  Administered 2020-10-13: 14:00:00 2000 mg/h via INTRAVENOUS

## 2020-10-13 MED ORDER — SUCCINYLCHOLINE CHLORIDE 200 MG/10ML IV SOSY
200 | INTRAVENOUS | Status: AC
Start: 2020-10-13 — End: 2020-10-13

## 2020-10-13 MED ORDER — NORMAL SALINE FLUSH 0.9 % IV SOLN
0.9 | INTRAVENOUS | Status: DC | PRN
Start: 2020-10-13 — End: 2020-10-16

## 2020-10-13 MED ORDER — AZITHROMYCIN 500 MG IV SOLR
500 MG | INTRAVENOUS | Status: AC
Start: 2020-10-13 — End: 2020-10-13
  Administered 2020-10-13: 14:00:00 500 mg via INTRAVENOUS

## 2020-10-13 MED ORDER — FAMOTIDINE 20 MG/2ML IV SOLN
202 MG/2ML | Freq: Two times a day (BID) | INTRAVENOUS | Status: DC | PRN
Start: 2020-10-13 — End: 2020-10-13
  Administered 2020-10-13: 04:00:00 20 mg via INTRAVENOUS

## 2020-10-13 MED ORDER — SIMETHICONE 80 MG PO CHEW
80 MG | Freq: Four times a day (QID) | ORAL | Status: DC | PRN
Start: 2020-10-13 — End: 2020-10-16

## 2020-10-13 MED ORDER — HYDROMORPHONE HCL 2 MG/ML IJ SOLN
2 | INTRAMUSCULAR | Status: AC
Start: 2020-10-13 — End: 2020-10-13

## 2020-10-13 MED ORDER — OXYCODONE HCL 5 MG PO TABS
5 MG | ORAL | Status: DC | PRN
Start: 2020-10-13 — End: 2020-10-16

## 2020-10-13 MED FILL — DEXAMETHASONE SOD PHOSPHATE PF 10 MG/ML IJ SOLN: 10 mg/mL | INTRAMUSCULAR | Qty: 1

## 2020-10-13 MED FILL — DEXAMETH SOD PHOS-BUPIV-EPIN 0.01-0.375 %-1:200000 IJ SOSY: 0.01-0.375 %-1:200000 | INTRAMUSCULAR | Qty: 60

## 2020-10-13 MED FILL — XYLOCAINE-MPF/EPINEPHRINE 2 %-1:200000 IJ SOLN: 2 %-1:00000 | INTRAMUSCULAR | Qty: 20

## 2020-10-13 MED FILL — FENTANYL CITRATE (PF) 100 MCG/2ML IJ SOLN: 100 MCG/2ML | INTRAMUSCULAR | Qty: 2

## 2020-10-13 MED FILL — SODIUM BICARBONATE 4.2 % IV SOLN: 4.2 % | INTRAVENOUS | Qty: 5

## 2020-10-13 MED FILL — SUCCINYLCHOLINE CHLORIDE 200 MG/10ML IV SOSY: 200 MG/10ML | INTRAVENOUS | Qty: 10

## 2020-10-13 MED FILL — AZITHROMYCIN 500 MG IV SOLR: 500 mg | INTRAVENOUS | Qty: 500

## 2020-10-13 MED FILL — ALBUTEROL SULFATE HFA 108 (90 BASE) MCG/ACT IN AERS: 108 (90 Base) MCG/ACT | RESPIRATORY_TRACT | Qty: 1.2

## 2020-10-13 MED FILL — CEFAZOLIN SODIUM 1 G IJ SOLR: 1 g | INTRAMUSCULAR | Qty: 2000

## 2020-10-13 MED FILL — DILAUDID 2 MG/ML IJ SOLN: 2 mg/mL | INTRAMUSCULAR | Qty: 1

## 2020-10-13 MED FILL — MAGNESIUM SULFATE 20 GM/500ML IV SOLN: 20 GM/500ML | INTRAVENOUS | Qty: 500

## 2020-10-13 MED FILL — TRANEXAMIC ACID 1000 MG/10ML IV SOLN: 1000 MG/10ML | INTRAVENOUS | Qty: 20

## 2020-10-13 MED FILL — KETOROLAC TROMETHAMINE 30 MG/ML IJ SOLN: 30 mg/mL | INTRAMUSCULAR | Qty: 1

## 2020-10-13 MED FILL — DIPHENHYDRAMINE HCL 50 MG/ML IJ SOLN: 50 mg/mL | INTRAMUSCULAR | Qty: 1

## 2020-10-13 MED FILL — ROCURONIUM BROMIDE 50 MG/5ML IV SOLN: 50 MG/5ML | INTRAVENOUS | Qty: 5

## 2020-10-13 MED FILL — MOMETASONE FURO-FORMOTEROL FUM 200-5 MCG/ACT IN AERO: 200-5 MCG/ACT | RESPIRATORY_TRACT | Qty: 1.17

## 2020-10-13 MED FILL — DIPRIVAN 200 MG/20ML IV EMUL: 200 MG/20ML | INTRAVENOUS | Qty: 20

## 2020-10-13 MED FILL — MAGNESIUM SULFATE 4 GM/100ML IV SOLN: 4 GM/100ML | INTRAVENOUS | Qty: 100

## 2020-10-13 MED FILL — ROPIVACAINE 0.2% IN SODIUM CHLORIDE 0.9% (OB) EPIDURAL SYRINGE: Qty: 20

## 2020-10-13 MED FILL — PHENYLEPHRINE HCL (PRESSORS) 1 MG/10ML IV SOSY: 1 MG/0ML | INTRAVENOUS | Qty: 10

## 2020-10-13 MED FILL — BRIDION 200 MG/2ML IV SOLN: 200 MG/2ML | INTRAVENOUS | Qty: 2

## 2020-10-13 MED FILL — ROPIVACAINE HCL 2 MG/ML IJ SOLN: INTRAMUSCULAR | Qty: 100

## 2020-10-13 MED FILL — EPHEDRINE SULFATE (PRESSORS) 50 MG/ML IV SOLN: 50 mg/mL | INTRAVENOUS | Qty: 1

## 2020-10-13 MED FILL — NAROPIN 5 MG/ML IJ SOLN: 5 mg/mL | INTRAMUSCULAR | Qty: 30

## 2020-10-13 MED FILL — CEFAZOLIN SODIUM-DEXTROSE 2-4 GM/100ML-% IV SOLN: 2-4 GM/100ML-% | INTRAVENOUS | Qty: 100

## 2020-10-13 MED FILL — ONDANSETRON HCL 4 MG/2ML IJ SOLN: 4 MG/2ML | INTRAMUSCULAR | Qty: 2

## 2020-10-13 MED FILL — MIDAZOLAM HCL 2 MG/2ML IJ SOLN: 2 mg/mL | INTRAMUSCULAR | Qty: 2

## 2020-10-13 MED FILL — ACETAMINOPHEN 10 MG/ML IV SOLN: 10 mg/mL | INTRAVENOUS | Qty: 100

## 2020-10-13 MED FILL — SOD CITRATE-CITRIC ACID 500-334 MG/5ML PO SOLN: 500-334 MG/5ML | ORAL | Qty: 30

## 2020-10-13 MED FILL — FAMOTIDINE 20 MG/2ML IV SOLN: 20 MG/2ML | INTRAVENOUS | Qty: 2

## 2020-10-13 MED FILL — KETOROLAC TROMETHAMINE 60 MG/2ML IM SOLN: 60 MG/2ML | INTRAMUSCULAR | Qty: 2

## 2020-10-13 MED FILL — INDIGO CARMINE 8 MG/ML IJ SOLN: 8 mg/mL | INTRAMUSCULAR | Qty: 5

## 2020-10-13 NOTE — Progress Notes (Signed)
Nutrition Note    Received nursing nutrition screen referral for ethnic food choices. Patient now post partum and on Regular diet. RD sign off to diet tech to continue to monitor.    Electronically signed by Townsend Roger, MS, RD, LD on 10/13/20 at 10:17 AM EST    Contact: pager [phone]

## 2020-10-14 LAB — ADD ON LAB TEST

## 2020-10-14 LAB — HEMOGLOBIN: Hemoglobin: 9.6 g/dL — ABNORMAL LOW (ref 11.7–16.0)

## 2020-10-14 MED FILL — ACETAMINOPHEN 325 MG PO TABS: 325 mg | ORAL | Qty: 2

## 2020-10-14 MED FILL — OXYCODONE HCL 5 MG PO TABS: 5 mg | ORAL | Qty: 1

## 2020-10-14 MED FILL — FERROUS SULFATE 325 (65 FE) MG PO TABS: 325 (65 Fe) MG | ORAL | Qty: 1

## 2020-10-14 MED FILL — IBUPROFEN 600 MG PO TABS: 600 mg | ORAL | Qty: 1

## 2020-10-14 MED FILL — KETOROLAC TROMETHAMINE 30 MG/ML IJ SOLN: 30 mg/mL | INTRAMUSCULAR | Qty: 1

## 2020-10-14 MED FILL — DOK 100 MG PO CAPS: 100 mg | ORAL | Qty: 1

## 2020-10-14 NOTE — Progress Notes (Signed)
Plan of care for pt and infant discussed via Nepali interpreter. Answered patient's questions and concerns at this time. Pediatrician list given to patient and educated patient that she needs to pick a pediatrician before discharge. Pt was agreeable to this. No other questions or concerns.

## 2020-10-14 NOTE — Progress Notes (Addendum)
I reviewed and agree with the care provided by the resident during or immediately following the visit including the patient's medical history, the resident's findings in the physical exam, patient's diagnosis and treatment plan.   Electronically signed by Mercer Pod, MD on 10/14/2020 at 6:10 PM        POST OPERATIVE DAY # 1    Terri Summers is a 29 y.o. who was seen & examined today.     Her pregnancy was complicated by:   Patient Active Problem List   Diagnosis   ??? Non-English speaking patient   ??? Previous cesarean delivery affecting pregnancy   ??? History of macrosomia in infant in prior pregnancy, currently pregnant   ??? Placenta previa antepartum   ??? Placenta accreta, antepartum   ??? High-risk pregnancy supervision, unspecified trimester   ??? Mild intermittent asthma without complication   ??? Vaginal discharge during pregnancy in third trimester   ??? Cough   ??? Cesarean delivery delivered       Today she is doing well without any chief complaint. Her lochia is light. She denies chest pain, shortness of breath, headache and lightheadedness. She is ambulating well. Flatus present. Bowel movement absent. Foley catheter removed this AM and has not yet voided spontaneously. She is tolerating solids. Pain is controlled yes.    Vital Signs:  Vitals:    10/13/20 1601 10/13/20 1941 10/14/20 0009 10/14/20 0426   BP: 115/77 1'04/66 95/60 99/73 '   Pulse: 106 91 86 71   Resp: '16 18 16 18   ' Temp: 97.9 ??F (36.6 ??C) 98 ??F (36.7 ??C) 97.5 ??F (36.4 ??C) 97.1 ??F (36.2 ??C)   TempSrc: Oral Temporal Temporal Temporal   SpO2: 97% 95% 96% 97%   Weight:       Height:           Urine Input & Output last 24hrs:     Intake/Output Summary (Last 24 hours) at 10/14/2020 0622  Last data filed at 10/14/2020 0009  Gross per 24 hour   Intake 2418 ml   Output 5059 ml   Net -2641 ml       Physical Exam:   General:  no apparent distress, alert and cooperative  Affect:  appropriate  Lungs:  No increased work of breathing, good air exchange  Abdomen:  abdomen soft, non-distended, non-tender  Fundus: non-tender, normal size, firm, below umbilicus   Incision: Dressing remains in place  Extremities:  no calf tenderness, non edematous    Labs:  Lab Results   Component Value Date    WBC 12.5 (H) 10/12/2020    HGB 9.6 (L) 10/14/2020    HCT 36.7 10/12/2020    MCV 86.3 10/12/2020    PLT 343 10/12/2020     O POS    Antibody Screen:    Antibody Screen   Date Value Ref Range Status   10/12/2020 NEG NA Final     Lab Results   Component Value Date    RUBELLAIGG 35.7 03/24/2020           LABOR DELIVERY ???     SCD's ONLY (labor through postpartum ambulation) SCD's PLUS   Prophylactic Anticoagulation   until discharge SCD's PLUS   Prophylactic Anticoagulation   for 6 weeks SCD's PLUS  Therapeutic Anticoagulation for 6 weeks   Vaginal Delivery   '[]'  BMI ? 40 kg/m2          Cesarean Delivery   All patients Vaginal Delivery   '[]'  BMI ?  40 kg/m2       AND  '[]'  Antepartum hospitalization ? 72 hours within the past month    Cesarean Delivery   1 Major Risk Factor:  '[]'  BMI ? 35 kg/m2   '[]'  Low Risk Thrombophilia  '[]'  PPH+RBCs, IR, or operation  '[]'  Infection+Antibiotics  '[]'  Antepartum hospitalization ? 72 hours within the past month   '[]'  PMH: Sickle Cell, SLE, Cardiac Dz, Active IBD, Active Cancer, Nephrotic Syndrome    OR 2 Minor Risk Factors:  '[]'  Multiple gestation  '[]'  Age > 40  '[]'  PPH ? 1,000cc  '[]'  (+)FMH of VTE  '[]'  Smoker  '[]'  Preeclampsia '[]'  BMI ? 40 kg/m2       AND  '[]'  Low Risk Thrombophilia    OR     ANY OF THE FOLLOWING:  '[]'  High Risk Thrombophilia without prior VTE  '[]'  Low Risk Thrombophilia with (+)FMH of VTE  '[]'  Any single prior VTE ANY OF THE FOLLOWING:  '[]'  Already on LMWH/UFH  '[]'  Multiple prior VTE  '[]'  High Risk Thrombophilia with prior VTE     Low Risk Thrombophilia: FVL (heterozygous), Prothrombin (heterozygous), Protein C, Protein S  High Risk Thrombophilia: FVL (homozygous), Prothrombin (homozygous), FVL+Prothrombin (heterozygous), Antithrombin III, APLS      Assessment/Plan:  1. ARYIA DELIRA is a 29 y.o. POD # 1 s/p RLTCS 2/2 Failed TOLAC/RUQ pain     2. Postpartum Care  - Doing well, VSS    - Female infant   - Bottle feeding   - Contraception: Desires Nexplanon   - Encourage ambulation and use of incentive spirometer   - D/C foley catheter and saline lock IV on POD #1    - Postop Hb 9.6  - Postpartum VTE Prophylaxis: Not Indicated    3. PreEwSF  - Met criteria with mild range Bps and elevated LFTs  - Magnesium sulfate for seizure ppx, timed off today at 0853  - Repeat LFTs this AM  - Asymptomatic, Bps normotensive    4. Asthma  - Mild persistent  - Symbicort (substituted Dulera while inpatient) and albuterol PRN    5. Acute blood loss anemia  - Hb 11.9 --> 9.6  - EBL 600  - Asymptomatic    6. Disposition: Continue current care Based on my clinical assessment, this patient is  safe for self discharge (does not need transport by wheelchair) if she so chooses.       Provider's Name: Ronnie Doss, DO     Vida Rigger, DO  10/14/2020, 6:22 AM

## 2020-10-15 LAB — COMPREHENSIVE METABOLIC PANEL
ALT: 40 U/L — ABNORMAL HIGH (ref 0–34)
AST: 44 U/L (ref 15–46)
Albumin,Serum: 2.5 g/dL — ABNORMAL LOW (ref 3.5–5.0)
Alkaline Phosphatase: 114 U/L (ref 38–126)
Anion Gap: 5 mmol/L (ref 3–13)
BUN: 13 mg/dL (ref 9–20)
CO2: 25 mmol/L (ref 22–30)
Calcium: 8.6 mg/dL (ref 8.4–10.4)
Chloride: 106 mmol/L (ref 98–107)
Creatinine: 0.55 mg/dL (ref 0.52–1.25)
EGFR IF NonAfrican American: >90 mL/min (ref >60)
Glucose: 70 mg/dL (ref 70–100)
Potassium: 4 mmol/L (ref 3.5–5.1)
Sodium: 135 mmol/L (ref 135–145)
Total Bilirubin: 0.3 mg/dL (ref 0.2–1.3)
Total Protein: 5.3 g/dL — ABNORMAL LOW (ref 6.3–8.2)
eGFR African American: >90 mL/min (ref >60)

## 2020-10-15 MED ORDER — ETONOGESTREL 68 MG SC IMPL
68 MG | Freq: Once | SUBCUTANEOUS | Status: AC
Start: 2020-10-15 — End: 2020-10-15
  Administered 2020-10-15: 14:00:00 68 mg via SUBCUTANEOUS

## 2020-10-15 MED ORDER — LIDOCAINE HCL (PF) 1 % IJ SOLN
1 % | Freq: Once | INTRAMUSCULAR | Status: AC
Start: 2020-10-15 — End: 2020-10-15
  Administered 2020-10-15: 14:00:00 1 mL via INTRADERMAL

## 2020-10-15 MED ORDER — LIDOCAINE HCL (PF) 1 % IJ SOLN
1 % | INTRAMUSCULAR | Status: DC
Start: 2020-10-15 — End: 2020-10-15

## 2020-10-15 MED FILL — ACETAMINOPHEN 325 MG PO TABS: 325 mg | ORAL | Qty: 2

## 2020-10-15 MED FILL — IBUPROFEN 600 MG PO TABS: 600 mg | ORAL | Qty: 1

## 2020-10-15 MED FILL — OXYCODONE HCL 5 MG PO TABS: 5 mg | ORAL | Qty: 1

## 2020-10-15 MED FILL — XYLOCAINE-MPF 1 % IJ SOLN: 1 % | INTRAMUSCULAR | Qty: 2

## 2020-10-15 MED FILL — DOK 100 MG PO CAPS: 100 mg | ORAL | Qty: 1

## 2020-10-15 MED FILL — FERROUS SULFATE 325 (65 FE) MG PO TABS: 325 (65 Fe) MG | ORAL | Qty: 1

## 2020-10-15 MED FILL — XYLOCAINE-MPF 1 % IJ SOLN: 1 % | INTRAMUSCULAR | Qty: 5

## 2020-10-15 MED FILL — NEXPLANON 68 MG SC IMPL: 68 mg | SUBCUTANEOUS | Qty: 68

## 2020-10-15 NOTE — Progress Notes (Deleted)
Postpartum Nexplanon Insertion Procedure Note    Pre-operative Diagnosis: Postpartum; Desires Long Acting Reversible Contraception with Nexplanon    Post-operative Diagnosis: same    Indications: undesired fertility    Procedure Details   The patient was positioned comfortably on our procedure table. She was consented earlier in the appointment and the procedure risk and complications were reviewed. A sterile prep and drape was completed and a 1% lidocaine for local anesthetic was utilized, 3 ml. Nexplanon was inserted per protocol without difficulty in the right upper arm.  A sterile dressing was applied with a pressure wrap. The patient tolerated the procedure well.     Nexplanon Information:  Lot # [id number] , Expiration date 02/05/2023.    Condition:  Stable    Complications:  None    Plan:    Formal restrictions were discussed in detail. She is to notify our office if any swelling, redness, temperature, or limb restriction or numbness. No baths, Pools or Lakes until Follow up. Showers are allowed in 36 hours. She understands the most common side effect of irregular bleeding, and that this may be increased during the postpartum period. She was advised to use OTC ibuprofen as needed for mild to moderate pain. She will follow up for postpartum follow up. She is instructed to adhere to pelvic rest for 6 weeks. She was counseled on the effects of Nexplanon use while breast feeding.     Dr. Oleta Mouse was present for the procedure    Barbaraann Barthel, MD  10/15/2020, 9:04 AM

## 2020-10-15 NOTE — Progress Notes (Addendum)
I reviewed and agree with the care provided by the resident/CNM during the visit including the patient's medical history, the resident's findings in the physical exam, patient's diagnosis and treatment plan.   Electronically signed by Terri Ghent, MD on 10/15/2020 at 8:26 AM   POST OPERATIVE DAY # 2    Terri Summers is a 29 y.o. female G2P2002  This patient was seen & examined today.     Her pregnancy was complicated by:   Patient Active Problem List   Diagnosis   ??? Non-English speaking patient   ??? Previous cesarean delivery affecting pregnancy   ??? History of macrosomia in infant in prior pregnancy, currently pregnant   ??? Placenta previa antepartum   ??? Placenta accreta, antepartum   ??? High-risk pregnancy supervision, unspecified trimester   ??? Mild intermittent asthma without complication   ??? Vaginal discharge during pregnancy in third trimester   ??? Cough   ??? Cesarean delivery delivered   ??? Acute blood loss anemia       Today she is doing well without any chief complaint. Her lochia is light. She denies chest pain, shortness of breath, headache nausea, vomiting, and blurred vision. She is ambulating well. Flatus present. Bowel movement present. Voiding spontaneously. She is tolerating solids. Pain is controlled.    Vital Signs:  Vitals:    10/14/20 1207 10/14/20 1639 10/14/20 2103 10/15/20 0359   BP: 91/69 99/76 108/73 106/71   Pulse: 69 88 88 86   Resp: 16 16 16 16    Temp: 97.9 ??F (36.6 ??C) 97.8 ??F (36.6 ??C) 97.1 ??F (36.2 ??C) 97.3 ??F (36.3 ??C)   TempSrc: Temporal Temporal Temporal Temporal   SpO2: 97% 100% 98% 97%   Weight:       Height:           Urine Input & Output last 24hrs:     Intake/Output Summary (Last 24 hours) at 10/15/2020 10/17/2020  Last data filed at 10/14/2020 0630  Gross per 24 hour   Intake 400 ml   Output 1400 ml   Net -1000 ml       Physical Exam:   General:  no apparent distress, alert and cooperative  Affect:  appropriate  Lungs:  No increased work of breathing, good air  exchange  Abdomen: abdomen soft, non-distended, non-tender  Fundus: non-tender, normal size, firm, below umbilicus   Incision: Clean, dry, and intact  Extremities:  no calf tenderness, non edematous    Labs:  Lab Results   Component Value Date    WBC 12.5 (H) 10/12/2020    HGB 9.6 (L) 10/14/2020    HCT 36.7 10/12/2020    MCV 86.3 10/12/2020    PLT 343 10/12/2020     O POS    Antibody Screen:    Antibody Screen   Date Value Ref Range Status   10/12/2020 NEG NA Final     Lab Results   Component Value Date    RUBELLAIGG 35.7 03/24/2020           LABOR DELIVERY ???     SCD's ONLY (labor through postpartum ambulation) SCD's PLUS   Prophylactic Anticoagulation   until discharge SCD's PLUS   Prophylactic Anticoagulation   for 6 weeks SCD's PLUS  Therapeutic Anticoagulation for 6 weeks   Vaginal Delivery   []  BMI ? 40 kg/m2          Cesarean Delivery   All patients Vaginal Delivery   []  BMI ? 40 kg/m2  AND  []  Antepartum hospitalization ? 72 hours within the past month    Cesarean Delivery   1 Major Risk Factor:  []  BMI ? 35 kg/m2   []  Low Risk Thrombophilia  []  PPH+RBCs, IR, or operation  []  Infection+Antibiotics  []  Antepartum hospitalization ? 72 hours within the past month   []  PMH: Sickle Cell, SLE, Cardiac Dz, Active IBD, Active Cancer, Nephrotic Syndrome    OR 2 Minor Risk Factors:  []  Multiple gestation  []  Age > 40  []  PPH ? 1,000cc  []  (+)FMH of VTE  []  Smoker  [x]  Preeclampsia []  BMI ? 40 kg/m2       AND  []  Low Risk Thrombophilia    OR     ANY OF THE FOLLOWING:  []  High Risk Thrombophilia without prior VTE  []  Low Risk Thrombophilia with (+)FMH of VTE  []  Any single prior VTE ANY OF THE FOLLOWING:  []  Already on LMWH/UFH  []  Multiple prior VTE  []  High Risk Thrombophilia with prior VTE     Low Risk Thrombophilia: FVL (heterozygous), Prothrombin (heterozygous), Protein C, Protein S  High Risk Thrombophilia: FVL (homozygous), Prothrombin (homozygous), FVL+Prothrombin (heterozygous), Antithrombin III, APLS      Assessment/Plan:  1. Terri Summers is a POD # 2 s/p RLTCS 2/2 Failed TOLAC     2. Postpartum Care  - Doing well, VSS    - Female infant   - Bottle feeding   - Contraception: Desires Nexplanon   - Encourage ambulation and use of incentive spirometer   - D/C foley catheter and saline lock IV on POD #1    - Postop Hb 9.6  - Postpartum VTE Prophylaxis: Not Indicated    3. PreEwSF  - BP normotensive  - Asymptomatic   - S/p magnesium sulfate for seizure ppx   - Repeat CMP this AM    4. Acute Blood Loss Anemia  - Hgb 11.9->9.6  - EBL  - Asymptomatic     5. Disposition: Continue current care Based on my clinical assessment, this patient is  safe for self discharge (does not need transport by wheelchair) if she so chooses.       Provider's Name: , DO     , MD  10/15/2020, 6:13 AM

## 2020-10-15 NOTE — Procedures (Signed)
Postpartum Nexplanon Insertion Procedure Note    Pre-operative Diagnosis: Postpartum; Desires Long Acting Reversible Contraception with Nexplanon    Post-operative Diagnosis: same    Indications: undesired fertility    Procedure Details   The patient was positioned comfortably on our procedure table. She was consented earlier in the appointment and the procedure risk and complications were reviewed. A sterile prep and drape was completed and a 1% lidocaine for local anesthetic was utilized, 3 ml. Nexplanon was inserted per protocol without difficulty in the right upper arm.  A sterile dressing was applied with a pressure wrap. The patient tolerated the procedure well.     Nexplanon Information:  Lot # [id number] , Expiration date 02/05/2023.    Condition:  Stable    Complications:  None    Plan:    Formal restrictions were discussed in detail. She is to notify our office if any swelling, redness, temperature, or limb restriction or numbness. No baths, Pools or Lakes until Follow up. Showers are allowed in 36 hours. She understands the most common side effect of irregular bleeding, and that this may be increased during the postpartum period. She was advised to use OTC ibuprofen as needed for mild to moderate pain. She will follow up for postpartum follow up. She is instructed to adhere to pelvic rest for 6 weeks. She was counseled on the effects of Nexplanon use while breast feeding.     Dr. Ormond was present for the procedure    Jonel Weldon W Keigen Caddell, MD  10/15/2020, 9:04 AM

## 2020-10-16 MED ORDER — IBUPROFEN 600 MG PO TABS
600 MG | ORAL_TABLET | Freq: Four times a day (QID) | ORAL | 3 refills | Status: AC
Start: 2020-10-16 — End: ?

## 2020-10-16 MED ORDER — OXYCODONE HCL 5 MG PO TABS
5 MG | ORAL_TABLET | Freq: Four times a day (QID) | ORAL | 0 refills | Status: AC | PRN
Start: 2020-10-16 — End: 2020-10-21

## 2020-10-16 MED ORDER — DOCUSATE SODIUM 100 MG PO CAPS
100 MG | ORAL_CAPSULE | Freq: Two times a day (BID) | ORAL | 3 refills | Status: AC | PRN
Start: 2020-10-16 — End: ?

## 2020-10-16 MED ORDER — FERROUS SULFATE 325 (65 FE) MG PO TABS
325 (65 Fe) MG | ORAL_TABLET | Freq: Every day | ORAL | 3 refills | Status: AC
Start: 2020-10-16 — End: ?

## 2020-10-16 MED FILL — DOK 100 MG PO CAPS: 100 mg | ORAL | Qty: 1

## 2020-10-16 MED FILL — IBUPROFEN 600 MG PO TABS: 600 mg | ORAL | Qty: 1

## 2020-10-16 MED FILL — OXYCODONE HCL 5 MG PO TABS: 5 mg | ORAL | Qty: 1

## 2020-10-16 MED FILL — ACETAMINOPHEN 325 MG PO TABS: 325 mg | ORAL | Qty: 2

## 2020-10-16 MED FILL — FERROUS SULFATE 325 (65 FE) MG PO TABS: 325 (65 Fe) MG | ORAL | Qty: 1

## 2020-10-16 NOTE — Progress Notes (Signed)
1) Green bracelet and magnet given to patient and explained.     a. She put the bracelet on and states understanding the importance of caregivers knowing she is postpartum and at risk of high blood pressure.    b. We read the magnet together and she states a plan to put it on her fridge.    c. She can state the signs and symptoms of severe hypertension/preeclampsia after discharge that would alert her to seek immediate care.      2) We discussed the importance of her postdischarge follow-up appointment. Patient states understanding and her postpartum check in three days.     3) Antihypertensive medication prescription: N/A    She denies any questions.        Pt watched discharge teaching video. Reviewed Guide to Caring for Yourself, discharge instructions and home medication list.   Encouraged 1) use of the post-birth warning signs magnet 2) use of the green armband if pre-e hx, and 3) and after discharge to watch for these signs and symptoms:  ??? Fever - Oral temperature greater than 100.4 degrees Fahrenheit  ??? Foul-smelling vaginal discharge  ??? Headache unrelieved by pain medication  ??? Difficulty urinating  ??? Breasts reddened, hard, hot to the touch  ??? Nipple discharge which is foul-smelling or contains pus  ??? Increased pain at the site of the laceration  ??? Sudden increased vaginal bleeding, soaking a large pad front to back in 1 hour  ??? Passing any blood clots bigger than a large egg  ??? Difficulty breathing with or without chest pain  ??? New calf pain especially if only on one side  ??? Unrelieved feelings of inability to cope     Patient states understanding and denies questions.

## 2020-10-16 NOTE — Telephone Encounter (Signed)
Patient delivered via unscheduled repeat cesarean section on 10/13/20. She had preeclampsia with severe features during labor. She is being discharged today, 10/16/20, and needs BP check within 72 hours of discharge. Please call patient to schedule. Thank you.

## 2020-10-16 NOTE — Lactation Note (Signed)
Patient states she is giving formula, but plans to breastfeed at home. Patient needs a personal use pump for home, patient given a personal use ameda pump. Showed patient how to set-up, clean, and use the pump. Explained different pump settings to patient also. Patient fitted with 25 mm flange. Patient instructed to pump every 2-3 hours at home for 8-10 times per day until infant is latching well and not receiving any formula. Patient also given breastfeeding educated in [place] language. Educated from booklet "Taking care of yourself and Baby"  Discussed advantages of breastfeeding, positions for breastfeeding, hunger cues, output parameters, hand expression/massage, milk storage guidelines, Lactations phone number and mothers support group.  Patient voices understanding.  Patient's questions answered.  Patient verbalized understanding, will continue to monitor and support patient.

## 2020-10-16 NOTE — Progress Notes (Addendum)
I reviewed and agree with the care provided by the resident/CNM during the visit including the patient's medical history, the resident's findings in the physical exam, patient's diagnosis and treatment plan.   Electronically signed by Tonia Ghent, MD on 10/16/2020 at 7:16 AM   POST OPERATIVE DAY # 3    Terri Summers is a 29 y.o. female G2P2002  This patient was seen & examined today.     Her pregnancy was complicated by:   Patient Active Problem List   Diagnosis   ??? Non-English speaking patient   ??? Previous cesarean delivery affecting pregnancy   ??? History of macrosomia in infant in prior pregnancy, currently pregnant   ??? Placenta previa antepartum   ??? Placenta accreta, antepartum   ??? High-risk pregnancy supervision, unspecified trimester   ??? Mild intermittent asthma without complication   ??? Vaginal discharge during pregnancy in third trimester   ??? Cough   ??? Cesarean delivery delivered   ??? Acute blood loss anemia   ??? Nexplanon insertion       Today she is doing well without any chief complaint. Her lochia is light. She denies chest pain, shortness of breath, headache nausea, vomiting, and blurred vision. She is ambulating well. Flatus present. Bowel movement absent. Voiding spontaneously. She is tolerating solids. Pain is controlled.    Vital Signs:  Vitals:    10/14/20 2103 10/15/20 0359 10/15/20 0747 10/15/20 2038   BP: 108/73 106/71 102/71 120/84   Pulse: 88 86 73 83   Resp: 16 16 16 14    Temp: 97.1 ??F (36.2 ??C) 97.3 ??F (36.3 ??C) 97.3 ??F (36.3 ??C) 98.8 ??F (37.1 ??C)   TempSrc: Temporal Temporal Temporal Temporal   SpO2: 98% 97% 98% 98%   Weight:       Height:           Urine Input & Output last 24hrs:   No intake or output data in the 24 hours ending 10/16/20 10/18/20    Physical Exam:   General:  no apparent distress, alert and cooperative  Affect:  appropriate  Lungs:  No increased work of breathing, good air exchange  Abdomen: abdomen soft, non-distended, non-tender  Fundus: non-tender, normal size,  firm, below umbilicus   Incision: Clean, dry, and intact  Extremities:  no calf tenderness, non edematous    Labs:  Lab Results   Component Value Date    WBC 12.5 (H) 10/12/2020    HGB 9.6 (L) 10/14/2020    HCT 36.7 10/12/2020    MCV 86.3 10/12/2020    PLT 343 10/12/2020     O POS    Antibody Screen:    Antibody Screen   Date Value Ref Range Status   10/12/2020 NEG NA Final     Lab Results   Component Value Date    RUBELLAIGG 35.7 03/24/2020           LABOR DELIVERY ???     SCD's ONLY (labor through postpartum ambulation) SCD's PLUS   Prophylactic Anticoagulation   until discharge SCD's PLUS   Prophylactic Anticoagulation   for 6 weeks SCD's PLUS  Therapeutic Anticoagulation for 6 weeks   Vaginal Delivery   []  BMI ? 40 kg/m2          Cesarean Delivery   All patients Vaginal Delivery   []  BMI ? 40 kg/m2       AND  []  Antepartum hospitalization ? 72 hours within the past month    Cesarean Delivery  1 Major Risk Factor:  []  BMI ? 35 kg/m2   []  Low Risk Thrombophilia  []  PPH+RBCs, IR, or operation  []  Infection+Antibiotics  []  Antepartum hospitalization ? 72 hours within the past month   []  PMH: Sickle Cell, SLE, Cardiac Dz, Active IBD, Active Cancer, Nephrotic Syndrome    OR 2 Minor Risk Factors:  []  Multiple gestation  []  Age > 40  []  PPH ? 1,000cc  []  (+)FMH of VTE  []  Smoker  [x]  Preeclampsia []  BMI ? 40 kg/m2       AND  []  Low Risk Thrombophilia    OR     ANY OF THE FOLLOWING:  []  High Risk Thrombophilia without prior VTE  []  Low Risk Thrombophilia with (+)FMH of VTE  []  Any single prior VTE ANY OF THE FOLLOWING:  []  Already on LMWH/UFH  []  Multiple prior VTE  []  High Risk Thrombophilia with prior VTE     Low Risk Thrombophilia: FVL (heterozygous), Prothrombin (heterozygous), Protein C, Protein S  High Risk Thrombophilia: FVL (homozygous), Prothrombin (homozygous), FVL+Prothrombin (heterozygous), Antithrombin III, APLS     Assessment/Plan:  1. Terri Summers is a POD # 3 s/p RLTCS 2/2 failed TOLAC and  concern for uterine rupture      2. Postpartum Care  - Doing well, VSS    - Female infant   - Bottle feeding   - Contraception: s/p Nexplanon insertion    - Encourage ambulation and use of incentive spirometer  - Postpartum VTE Prophylaxis: Not Indicated    3. PreEwSF   - Normotensive BPs   - s/p magnesium sulfate for seizure PPx   - asymptomatic   - repeat CMP w/ decrease in LFTs yesterday     4. Acute Blood Loss Anemia    - hgb 11.9 --> 9.6   - asymptomatic   - iron and colace     5. Disposition: Plan for BP check within 72hr of discharge Based on my clinical assessment, this patient is  safe for self discharge (does not need transport by wheelchair) if she so chooses.       Provider's Name: , DO     , DO  10/16/2020, 6:29 AM

## 2020-10-16 NOTE — Discharge Summary (Signed)
[facility] APPOINT[facility]    10/18/20  Tuesday  1:00pm    D.Leshay Desaulniers [place]

## 2020-10-16 NOTE — Discharge Summary (Signed)
Department of Obstetrics and Gynecology  Postpartum Delivery Discharge Summary    Admission on 10/12/2020 11:57 AM   Reason for admission: PROM   Intrapartum Course: Augmentation with pitocin. After epidural, patient became acutely uncomfortable with attempts to control her pain unsuccessful. Patient acutely worsening in pain worst along previous incision. Patient yells in pain with attempts to reposition her. Informed patient of concern for possible progressing uterine rupture and the recommendation for immediate cesarean section. 30 min urgent cesarean section was called. During her labor, patient met criteria for PreEwSF based on mild range BPs and LFTs greater than 2x uppler limit of normal     [redacted]w[redacted]d PC-01 Indications for Delivery: Was patient delivered between [id number]?  NO    Surgical Operations & Procedures:    Date of delivery: 10/13/20   Delivery Type: C-section with labor   Anesthesia: Epidural anesthesia; general ET anesthesia required given epidural not working after start of cesarean section   Laceration(s): n/a   Delivery Complications: Bladder adhesions requiring a high hysterotomy in the contractile portion of the uterus. Would recommend a REPEAT CESAREAN for subsequent pregnancies and advise against future TOLAC. Timing of delivery in future pregnancy should be consistent with timing that would be done for patients with a h/o classical c-section.    EBL: 600 cc    Pertinent Findings & Procedures:   Information for the patient's newborn:  Terri Summers, Bratcher2[[id number]]female   Birth Weight: 7 lb 12.5 oz (3.53 kg)     Apgars:   Information for the patient's newborn:  Terri Summers, Rogalski2[[id number]]One Minute Apgar: 7  Five Minute Apgar: 9      Postpartum   Course: Magnesium sulfate was continued for 24 hours postpartum for seizure PPx. BPs remained mild range to normotensive. Hgb postop was noted to be 9.4 from 11.9. Patient started on PO iron and colace. CMP repeated on POD#2 and LFTs  improved.    Infant: Female infant   Blood Type/Rh:  O POS     Antibody Screen:    Antibody Screen   Date Value Ref Range Status   10/12/2020 NEG NA Final      Rubella:    Lab Results   Component Value Date    RUBELLAIGG 35.7 03/24/2020        Contraception: Nexplanon     Breastfeeding: no    Postpartum VTE Prophylaxis: Not Indicated    Meds:      Medication List      START taking these medications    docusate sodium 100 MG capsule  Commonly known as: COLACE  Take 1 capsule by mouth 2 times daily as needed for Constipation     ferrous sulfate 325 (65 Fe) MG tablet  Commonly known as: IRON 325  Take 1 tablet by mouth daily Take with orange juice on empty stomach     ibuprofen 600 MG tablet  Commonly known as: ADVIL;MOTRIN  Take 1 tablet by mouth every 6 hours     oxyCODONE 5 MG immediate release tablet  Commonly known as: ROXICODONE  Take 1 tablet by mouth every 6 hours as needed for Pain for up to 5 days.        CONTINUE taking these medications    albuterol sulfate HFA 108 (90 Base) MCG/ACT inhaler  Commonly known as: Ventolin HFA  Inhale 2 puffs into the lungs 4 times daily as needed for Wheezing     budesonide-formoterol 160-4.5 MCG/ACT  Aero  Commonly known as: Symbicort  Inhale 2 puffs into the lungs 2 times daily     famotidine 20 MG tablet  Commonly known as: PEPCID  Take 1 tablet by mouth 2 times daily     prenatal vitamin 27-1 MG Tabs tablet  Take 1 tablet by mouth daily        STOP taking these medications    pyridoxine 25 MG tablet  Commonly known as: B-6           Where to Get Your Medications      These medications were sent to [id number] [place] - [phone] - F [phone]  [id number]   Phone: [phone]  ?? docusate sodium 100 MG capsule  ?? ferrous sulfate 325 (65 Fe) MG tablet  ?? ibuprofen 600 MG tablet  ?? oxyCODONE 5 MG immediate release tablet         Activity: Activity as tolerated    Diet: Regular diet    Follow up  Care:  [facility] in 4 weeks [facility] BP [facility]    Condition on discharge: Stable     Discharge to: Home  Discharge date: 10/16/20     Discharge Dx: see below    Instructions to Patient::     Pelvic Rest (no intercourse, tampons, douching, etc) x 6 weeks   Specific discharge instruction printed    [redacted] weeks gestation of pregnancy [Z3A.40]  Patient Active Problem List   Diagnosis   ??? Non-English speaking patient   ??? Previous cesarean delivery affecting pregnancy   ??? History of macrosomia in infant in prior pregnancy, currently pregnant   ??? Placenta previa antepartum   ??? Placenta accreta, antepartum   ??? High-risk pregnancy supervision, unspecified trimester   ??? Mild intermittent asthma without complication   ??? Vaginal discharge during pregnancy in third trimester   ??? Cough   ??? Cesarean delivery delivered   ??? Acute blood loss anemia   ??? Nexplanon insertion   ??? Pre-eclampsia, severe, with delivery         Comments:  Home care, Follow-up care and birth control were reviewed.  Signs and symptoms of mastitis and Post Partum Depression were reviewed.  The patient is to notify her physician if any of these occur.     BRudean Haskell DO on 10/16/2020 at 6:35 AM

## 2020-10-16 NOTE — Other (Unsigned)
Patient Acct Nbr: [id number]   Primary AUTH/CERT:   Primary Insurance Company Name: [facility]  [employer] Plan name: [employer] [facility]  Primary Insurance Group Number: [id number]  Primary Insurance Plan Type: Health  Primary Insurance Policy Number: [id number]

## 2020-10-19 ENCOUNTER — Ambulatory Visit
Admit: 2020-10-19 | Discharge: 2020-10-19 | Payer: MEDICAID | Attending: Student in an Organized Health Care Education/Training Program

## 2020-10-19 DIAGNOSIS — Z0130 Encounter for examination of blood pressure without abnormal findings: Secondary | ICD-10-CM

## 2020-10-19 MED ORDER — FAMOTIDINE 20 MG PO TABS
20 MG | ORAL_TABLET | Freq: Two times a day (BID) | ORAL | 6 refills | Status: AC
Start: 2020-10-19 — End: ?

## 2020-10-19 MED ORDER — ALBUTEROL SULFATE HFA 108 (90 BASE) MCG/ACT IN AERS
108 (90 Base) MCG/ACT | Freq: Four times a day (QID) | RESPIRATORY_TRACT | 0 refills | Status: AC | PRN
Start: 2020-10-19 — End: ?

## 2020-10-19 NOTE — Progress Notes (Signed)
Terri Summers  10/19/2020              29 y.o.  Chief Complaint   Patient presents with   ??? Blood Pressure Check     b/p check, pt still has bleeding, pt need refill  albuterol sulfate HFA inhealer          Patient's last menstrual period was 01/16/2020.           Primary CarePhysician: No primary care provider on file.  HPI:   Terri Summers is a 29 y.o. female [phone] presents for  BP check. S/p CD on 12/16. Complicated by Pre E wSF. Currnelty on no medications. Asymptomatic today in the office Bps today in the office 129/86      Review of Systems:         Review of Systems   Constitutional: Negative for chills, fatigue and fever.   Respiratory: Negative for shortness of breath.    Cardiovascular: Negative for chest pain.   Gastrointestinal: Negative for abdominal distention, abdominal pain, nausea and vomiting.   Genitourinary: Negative for dysuria, flank pain, frequency, genital sores, urgency, vaginal bleeding, vaginal discharge and vaginal pain.   Neurological: Negative for weakness and light-headedness.       Physical Exam:    BP 129/86 (Site: Left Upper Arm, Position: Sitting, Cuff Size: Small Adult)    Pulse 92    Temp 96.8 ??F (36 ??C) (Temporal)    Wt 140 lb 9.6 oz (63.8 kg)    LMP 01/16/2020    BMI 27.46 kg/m??      Physical Exam  Vitals reviewed.   Constitutional:       General: She is not in acute distress.     Appearance: She is well-developed.   HENT:      Head: Normocephalic and atraumatic.   Eyes:      Conjunctiva/sclera: Conjunctivae normal.      Pupils: Pupils are equal, round, and reactive to light.   Cardiovascular:      Rate and Rhythm: Normal rate and regular rhythm.   Pulmonary:      Effort: Pulmonary effort is normal. No respiratory distress.   Abdominal:      General: There is no distension.      Palpations: Abdomen is soft.      Tenderness: There is no abdominal tenderness. There is no guarding.      Comments: Healed pfannenstiel incision with steris strips in place    Musculoskeletal:          General: Normal range of motion.      Cervical back: Normal range of motion.   Skin:     General: Skin is warm.   Neurological:      Mental Status: She is alert and oriented to person, place, and time.   Psychiatric:         Behavior: Behavior normal.           ASSESSMENT & PLAN    1. Mild intermittent asthma without complication    - albuterol sulfate HFA (VENTOLIN HFA) 108 (90 Base) MCG/ACT inhaler; Inhale 2 puffs into the lungs 4 times daily as needed for Wheezing  Dispense: 18 g; Refill: 0    2. Gastroesophageal reflux disease without esophagitis    - famotidine (PEPCID) 20 MG tablet; Take 1 tablet by mouth 2 times daily  Dispense: 60 tablet; Refill: 6    3. BP check  - bps normontensive; asymptomatic on no meds   -  to return in 3 weeks for Postpartum visit   - instructed pt to remove steris strips when she goes home as she declined to removed in the office today       Return in about 25 days (around 11/13/2020), or Postpartum Visit.

## 2020-11-15 ENCOUNTER — Encounter: Attending: Student in an Organized Health Care Education/Training Program
